# Patient Record
Sex: Female | Born: 1950 | Race: White | Hispanic: No | Marital: Married | Smoking: Former smoker
Health system: Southern US, Community
[De-identification: ages and names within clinical notes are randomized; demographics above are authoritative.]

## PROBLEM LIST (undated history)

## (undated) DIAGNOSIS — D696 Thrombocytopenia, unspecified: Principal | ICD-10-CM

## (undated) DIAGNOSIS — R112 Nausea with vomiting, unspecified: Secondary | ICD-10-CM

## (undated) DIAGNOSIS — I499 Cardiac arrhythmia, unspecified: Secondary | ICD-10-CM

## (undated) DIAGNOSIS — Z9289 Personal history of other medical treatment: Secondary | ICD-10-CM

## (undated) DIAGNOSIS — Z8639 Personal history of other endocrine, nutritional and metabolic disease: Secondary | ICD-10-CM

## (undated) DIAGNOSIS — R296 Repeated falls: Secondary | ICD-10-CM

## (undated) DIAGNOSIS — M8580 Other specified disorders of bone density and structure, unspecified site: Secondary | ICD-10-CM

## (undated) DIAGNOSIS — M1990 Unspecified osteoarthritis, unspecified site: Secondary | ICD-10-CM

## (undated) DIAGNOSIS — Z9889 Other specified postprocedural states: Secondary | ICD-10-CM

## (undated) HISTORY — PX: FRACTURE SURGERY: SHX138

## (undated) HISTORY — PX: EYE SURGERY: SHX253

## (undated) HISTORY — PX: OTHER SURGICAL HISTORY: SHX169

## (undated) HISTORY — PX: DILATION AND CURETTAGE OF UTERUS: SHX78

## (undated) HISTORY — DX: Thrombocytopenia, unspecified: D69.6

## (undated) HISTORY — PX: COLONOSCOPY: SHX174

## (undated) HISTORY — PX: APPENDECTOMY: SHX54

---

## 1998-07-20 ENCOUNTER — Other Ambulatory Visit: Admission: RE | Admit: 1998-07-20 | Discharge: 1998-07-20 | Payer: Self-pay | Admitting: Gynecology

## 1999-10-03 ENCOUNTER — Other Ambulatory Visit: Admission: RE | Admit: 1999-10-03 | Discharge: 1999-10-03 | Payer: Self-pay | Admitting: Gynecology

## 2000-09-10 ENCOUNTER — Encounter: Admission: RE | Admit: 2000-09-10 | Discharge: 2000-09-10 | Payer: Self-pay | Admitting: Gynecology

## 2000-09-10 ENCOUNTER — Encounter: Payer: Self-pay | Admitting: Gynecology

## 2001-02-18 ENCOUNTER — Other Ambulatory Visit: Admission: RE | Admit: 2001-02-18 | Discharge: 2001-02-18 | Payer: Self-pay | Admitting: Gynecology

## 2001-10-02 ENCOUNTER — Encounter: Payer: Self-pay | Admitting: Gynecology

## 2001-10-02 ENCOUNTER — Encounter: Admission: RE | Admit: 2001-10-02 | Discharge: 2001-10-02 | Payer: Self-pay | Admitting: Gynecology

## 2002-05-05 ENCOUNTER — Other Ambulatory Visit: Admission: RE | Admit: 2002-05-05 | Discharge: 2002-05-05 | Payer: Self-pay | Admitting: Gynecology

## 2003-04-15 ENCOUNTER — Ambulatory Visit (HOSPITAL_COMMUNITY): Admission: RE | Admit: 2003-04-15 | Discharge: 2003-04-15 | Payer: Self-pay | Admitting: Gastroenterology

## 2003-07-29 ENCOUNTER — Encounter: Admission: RE | Admit: 2003-07-29 | Discharge: 2003-07-29 | Payer: Self-pay | Admitting: Gynecology

## 2003-07-29 ENCOUNTER — Encounter: Payer: Self-pay | Admitting: Gynecology

## 2003-09-03 ENCOUNTER — Other Ambulatory Visit: Admission: RE | Admit: 2003-09-03 | Discharge: 2003-09-03 | Payer: Self-pay | Admitting: Gynecology

## 2004-09-26 ENCOUNTER — Other Ambulatory Visit: Admission: RE | Admit: 2004-09-26 | Discharge: 2004-09-26 | Payer: Self-pay | Admitting: Gynecology

## 2004-09-27 ENCOUNTER — Ambulatory Visit: Payer: Self-pay | Admitting: Internal Medicine

## 2004-12-09 ENCOUNTER — Encounter: Admission: RE | Admit: 2004-12-09 | Discharge: 2004-12-09 | Payer: Self-pay | Admitting: Gynecology

## 2005-01-30 ENCOUNTER — Ambulatory Visit: Payer: Self-pay | Admitting: Internal Medicine

## 2005-04-15 ENCOUNTER — Emergency Department (HOSPITAL_COMMUNITY): Admission: EM | Admit: 2005-04-15 | Discharge: 2005-04-15 | Payer: Self-pay | Admitting: Emergency Medicine

## 2005-10-25 ENCOUNTER — Other Ambulatory Visit: Admission: RE | Admit: 2005-10-25 | Discharge: 2005-10-25 | Payer: Self-pay | Admitting: Gynecology

## 2006-02-26 ENCOUNTER — Encounter: Admission: RE | Admit: 2006-02-26 | Discharge: 2006-02-26 | Payer: Self-pay | Admitting: Gynecology

## 2007-01-22 ENCOUNTER — Other Ambulatory Visit: Admission: RE | Admit: 2007-01-22 | Discharge: 2007-01-22 | Payer: Self-pay | Admitting: Gynecology

## 2007-03-05 ENCOUNTER — Encounter: Admission: RE | Admit: 2007-03-05 | Discharge: 2007-03-05 | Payer: Self-pay | Admitting: Gynecology

## 2008-03-31 ENCOUNTER — Other Ambulatory Visit: Admission: RE | Admit: 2008-03-31 | Discharge: 2008-03-31 | Payer: Self-pay | Admitting: Family Medicine

## 2008-04-09 ENCOUNTER — Encounter: Admission: RE | Admit: 2008-04-09 | Discharge: 2008-04-09 | Payer: Self-pay | Admitting: Family Medicine

## 2008-08-10 ENCOUNTER — Encounter: Admission: RE | Admit: 2008-08-10 | Discharge: 2008-08-10 | Payer: Self-pay | Admitting: Family Medicine

## 2008-08-25 ENCOUNTER — Ambulatory Visit: Payer: Self-pay | Admitting: Sports Medicine

## 2008-08-25 DIAGNOSIS — M1610 Unilateral primary osteoarthritis, unspecified hip: Secondary | ICD-10-CM | POA: Insufficient documentation

## 2008-08-25 DIAGNOSIS — M169 Osteoarthritis of hip, unspecified: Secondary | ICD-10-CM

## 2008-08-25 DIAGNOSIS — S82899A Other fracture of unspecified lower leg, initial encounter for closed fracture: Secondary | ICD-10-CM | POA: Insufficient documentation

## 2008-10-05 ENCOUNTER — Ambulatory Visit: Payer: Self-pay | Admitting: Sports Medicine

## 2009-09-20 ENCOUNTER — Encounter: Admission: RE | Admit: 2009-09-20 | Discharge: 2009-09-20 | Payer: Self-pay | Admitting: Family Medicine

## 2010-05-24 ENCOUNTER — Other Ambulatory Visit: Admission: RE | Admit: 2010-05-24 | Discharge: 2010-05-24 | Payer: Self-pay | Admitting: Family Medicine

## 2010-10-11 ENCOUNTER — Encounter: Admission: RE | Admit: 2010-10-11 | Discharge: 2010-10-11 | Payer: Self-pay | Admitting: Family Medicine

## 2010-10-25 ENCOUNTER — Encounter: Admission: RE | Admit: 2010-10-25 | Discharge: 2010-10-25 | Payer: Self-pay | Admitting: Family Medicine

## 2010-12-11 ENCOUNTER — Encounter: Payer: Self-pay | Admitting: Family Medicine

## 2011-04-07 NOTE — Op Note (Signed)
   NAME:  Lindsey Young, Lindsey Young                        ACCOUNT NO.:  [id number]   MEDICAL RECORD NO.:  [id number]                   PATIENT TYPE:  AMB   LOCATION:  ENDO                                 FACILITY:  [facility]   PHYSICIAN:  Danise Edge, M.D.                DATE OF BIRTH:  Apr 05, 1951   DATE OF PROCEDURE:  04/15/2003  DATE OF DISCHARGE:                                 OPERATIVE REPORT   PROCEDURE:  Diagnostic colonoscopy.   INDICATIONS:  The patient is a 60 year old female born Mar 23, 2051.  The  patient's father died of metastatic colon cancer.  The patient is here to  undergo her first colonoscopy with polypectomy to prevent colon cancer.   ENDOSCOPIST:  Danise Edge, M.D.   PREMEDICATION:  Versed 10 mg, Demerol 75 mg.   DESCRIPTION OF PROCEDURE:  After obtaining informed consent, the patient was  placed in the left lateral decubitus position.  I administered intravenous  Demerol and intravenous Versed to achieve conscious sedation for the  procedure.  The patient's blood pressure, oxygen saturation, and cardiac  rhythm were monitored throughout the procedure and documented in the medical  record.   Anal inspection was normal.  Digital rectal exam was normal.  The Olympus  pediatric colonoscope was introduced into the rectum and advanced to the  cecum.  A normal-appearing ileocecal valve was intubated and the distal  ileum inspected.  Colonic preparation for the exam today was excellent.   Rectum normal.   Sigmoid colon and descending colon normal.   Splenic flexure normal.   Transverse colon normal.   Hepatic flexure normal.   Ascending colon normal.   Cecum and ileocecal valve normal.   Distal ileum.    ASSESSMENT:  Normal proctocolonoscopy to the cecum.  No endoscopic evidence  for the presence of colorectal neoplasia.                                               Danise Edge, M.D.    MJ/MEDQ  D:  04/15/2003  T:  04/15/2003  Job:  [id number]   cc:    Lilla Shook, M.D.  [address], [address]  [place]  [place]  [address]  Fax: [phone]   Leatha Gilding. Mezer, M.D.  [address]  [place]  [place] [address]  Fax: [phone]

## 2011-10-17 ENCOUNTER — Ambulatory Visit: Payer: 59 | Attending: Family Medicine | Admitting: Physical Therapy

## 2011-10-17 DIAGNOSIS — M2550 Pain in unspecified joint: Secondary | ICD-10-CM | POA: Insufficient documentation

## 2011-10-17 DIAGNOSIS — M2560 Stiffness of unspecified joint, not elsewhere classified: Secondary | ICD-10-CM | POA: Insufficient documentation

## 2011-10-17 DIAGNOSIS — IMO0001 Reserved for inherently not codable concepts without codable children: Secondary | ICD-10-CM | POA: Insufficient documentation

## 2011-10-18 ENCOUNTER — Ambulatory Visit: Payer: 59 | Admitting: Physical Therapy

## 2011-10-25 ENCOUNTER — Encounter: Payer: 59 | Admitting: Physical Therapy

## 2011-10-30 ENCOUNTER — Encounter: Payer: 59 | Admitting: Physical Therapy

## 2011-11-01 ENCOUNTER — Encounter: Payer: 59 | Admitting: Physical Therapy

## 2011-11-06 ENCOUNTER — Encounter: Payer: 59 | Admitting: Physical Therapy

## 2011-11-08 ENCOUNTER — Encounter: Payer: 59 | Admitting: Physical Therapy

## 2011-11-15 ENCOUNTER — Encounter: Payer: 59 | Admitting: Physical Therapy

## 2011-11-22 ENCOUNTER — Encounter: Payer: 59 | Admitting: Physical Therapy

## 2011-11-27 ENCOUNTER — Encounter: Payer: 59 | Admitting: Physical Therapy

## 2011-11-29 ENCOUNTER — Encounter: Payer: 59 | Admitting: Physical Therapy

## 2011-12-19 ENCOUNTER — Other Ambulatory Visit: Payer: Self-pay | Admitting: Family Medicine

## 2011-12-19 DIAGNOSIS — Z1231 Encounter for screening mammogram for malignant neoplasm of breast: Secondary | ICD-10-CM

## 2012-01-01 ENCOUNTER — Ambulatory Visit
Admission: RE | Admit: 2012-01-01 | Discharge: 2012-01-01 | Disposition: A | Discharge status: Home / Self Care | Payer: 59 | Source: Ambulatory Visit | Attending: Family Medicine | Admitting: Family Medicine

## 2012-01-01 DIAGNOSIS — Z1231 Encounter for screening mammogram for malignant neoplasm of breast: Secondary | ICD-10-CM

## 2013-04-09 ENCOUNTER — Other Ambulatory Visit: Payer: Self-pay

## 2013-04-09 DIAGNOSIS — Z1231 Encounter for screening mammogram for malignant neoplasm of breast: Secondary | ICD-10-CM

## 2013-04-22 ENCOUNTER — Ambulatory Visit
Admission: RE | Admit: 2013-04-22 | Discharge: 2013-04-22 | Disposition: A | Discharge status: Home / Self Care | Payer: 59 | Source: Ambulatory Visit

## 2013-04-22 DIAGNOSIS — Z1231 Encounter for screening mammogram for malignant neoplasm of breast: Secondary | ICD-10-CM

## 2013-08-19 ENCOUNTER — Encounter (HOSPITAL_COMMUNITY): Payer: Self-pay | Admitting: Emergency Medicine

## 2013-08-19 ENCOUNTER — Emergency Department (HOSPITAL_COMMUNITY)
Admission: EM | Admit: 2013-08-19 | Discharge: 2013-08-19 | Disposition: A | Discharge status: Home / Self Care | Payer: 59 | Attending: Emergency Medicine | Admitting: Emergency Medicine

## 2013-08-19 ENCOUNTER — Emergency Department (HOSPITAL_COMMUNITY): Payer: 59

## 2013-08-19 DIAGNOSIS — S6990XA Unspecified injury of unspecified wrist, hand and finger(s), initial encounter: Secondary | ICD-10-CM | POA: Insufficient documentation

## 2013-08-19 DIAGNOSIS — S82002A Unspecified fracture of left patella, initial encounter for closed fracture: Secondary | ICD-10-CM

## 2013-08-19 DIAGNOSIS — Z79899 Other long term (current) drug therapy: Secondary | ICD-10-CM | POA: Insufficient documentation

## 2013-08-19 DIAGNOSIS — W010XXA Fall on same level from slipping, tripping and stumbling without subsequent striking against object, initial encounter: Secondary | ICD-10-CM | POA: Insufficient documentation

## 2013-08-19 DIAGNOSIS — S59909A Unspecified injury of unspecified elbow, initial encounter: Secondary | ICD-10-CM | POA: Insufficient documentation

## 2013-08-19 DIAGNOSIS — Y9301 Activity, walking, marching and hiking: Secondary | ICD-10-CM | POA: Insufficient documentation

## 2013-08-19 DIAGNOSIS — Y9289 Other specified places as the place of occurrence of the external cause: Secondary | ICD-10-CM | POA: Insufficient documentation

## 2013-08-19 DIAGNOSIS — S82009A Unspecified fracture of unspecified patella, initial encounter for closed fracture: Secondary | ICD-10-CM | POA: Insufficient documentation

## 2013-08-19 MED ORDER — OXYCODONE-ACETAMINOPHEN 5-325 MG PO TABS
ORAL_TABLET | ORAL | Status: AC
  Administered 2013-08-19: 1 via ORAL
  Filled 2013-08-19: qty 1

## 2013-08-19 MED ORDER — OXYCODONE-ACETAMINOPHEN 5-325 MG PO TABS
1.0000 | ORAL_TABLET | Freq: Once | ORAL | Status: AC
  Administered 2013-08-19: 1 via ORAL

## 2013-08-19 MED ORDER — OXYCODONE-ACETAMINOPHEN 5-325 MG PO TABS: 1.0000 | ORAL_TABLET | Freq: Four times a day (QID) | ORAL | Status: DC | PRN

## 2013-08-19 NOTE — ED Notes (Signed)
Patient fell today on both knees while walking.  She tripped on some pavement.  She is having pain that she rates as a five in the left knee area and right forearm and wrist.  Some swelling and bruising to left knee area.  No bruising or deformity to right arm or wrist.

## 2013-08-19 NOTE — ED Provider Notes (Signed)
CSN: [id number]     Arrival date & time 08/19/13  1846 History   First MD Initiated Contact with Patient 08/19/13 1900     Chief Complaint  Patient presents with  . Fall   (Consider location/radiation/quality/duration/timing/severity/associated sxs/prior Treatment) HPI Patient presents emergency department following a fall that occurred just prior to arrival.  The patient, states she tripped on raised area in the sidewalk.  Patient, states she landed on her left knee and right wrist.  Patient, states, that she felt that her left kneecap split on the raised area of the sidewalk.  Patient denies nausea, vomiting, weakness, numbness, or loss of consciousness.  Patient did not take any medications prior to arrival    No past medical history on file. Past Surgical History  Procedure Laterality Date  . Appendectomy     No family history on file. History  Substance Use Topics  . Smoking status: Not on file  . Smokeless tobacco: Not on file  . Alcohol Use: Not on file   OB History   Grav Para Term Preterm Abortions TAB SAB Ect Mult Living                 Review of Systems All other systems negative except as documented in the HPI. All pertinent positives and negatives as reviewed in the HPI. Allergies  Review of patient's allergies indicates no known allergies.  Home Medications   Current Outpatient Rx  Name  Route  Sig  Dispense  Refill  . cholecalciferol (VITAMIN D) 1000 UNITS tablet   Oral   Take 1,000 Units by mouth daily.         . fish oil-omega-3 fatty acids 1000 MG capsule   Oral   Take 1 g by mouth daily.         Marland Kitchen glucosamine-chondroitin 500-400 MG tablet   Oral   Take 1 tablet by mouth daily.          BP 113/64  Pulse 71  Temp(Src) 97.7 F (36.5 C) (Oral)  Resp 20  Wt 150 lb (68.04 kg)  SpO2 100% Physical Exam  Constitutional: She is oriented to person, place, and time. She appears well-developed and well-nourished. No distress.  HENT:  Head:  Normocephalic and atraumatic.  Cardiovascular: Normal rate, regular rhythm and normal heart sounds.   Pulmonary/Chest: Effort normal and breath sounds normal.  Musculoskeletal:       Right wrist: She exhibits tenderness. She exhibits normal range of motion, no bony tenderness, no swelling, no effusion and no deformity.       Left knee: She exhibits decreased range of motion, swelling, ecchymosis, deformity and abnormal patellar mobility.  Neurological: She is alert and oriented to person, place, and time.    ED Course  Procedures (including critical care time) Labs Review Labs Reviewed - No data to display Imaging Review Dg Elbow Complete Right  08/19/2013   CLINICAL DATA:  Fall  EXAM: RIGHT ELBOW - COMPLETE 3+ VIEW  COMPARISON:  None.  FINDINGS: No fracture or dislocation is seen.  The joint spaces are preserved.  No definite elbow joint effusion.  The visualized soft tissues are unremarkable.  IMPRESSION: No fracture or dislocation is seen.   Electronically Signed   By: Charline Bills M.D.   On: 08/19/2013 19:48   Dg Wrist Complete Right  08/19/2013   CLINICAL DATA:  Fall, right wrist pain  EXAM: RIGHT WRIST - COMPLETE 3+ VIEW  COMPARISON:  None.  FINDINGS: No fracture or dislocation  is seen.  Degenerative changes with radiocarpal narrowing.  The visualized soft tissues are unremarkable.  IMPRESSION: No fracture or dislocation is seen.   Electronically Signed   By: Charline Bills M.D.   On: 08/19/2013 19:47   Dg Knee Complete 4 Views Left  08/19/2013   CLINICAL DATA:  Larey Seat. Left knee pain.  EXAM: LEFT KNEE - COMPLETE 4+ VIEW  COMPARISON:  None  FINDINGS: The patella is fractured in its midportion with moderate separation estimated at 13 mm. A small loose fragment is noted also. The femur and tibia are intact. The fibula is normal. A small joint effusion is noted.  IMPRESSION: Displaced mid pole patellar fracture with slight comminution.   Electronically Signed   By: Loralie Champagne M.D.    On: 08/19/2013 19:48   I spoke with Dr. supple of  orthopedics.  He advised the patient come to his office at 8 AM.  Patient is advised of plan of questions were answered.  Patient does not have any further need for pain control at this time.  She is placed in a knee immobilizer with crutches. MDM      Carlyle Dolly, PA-C 08/19/13 2049

## 2013-08-20 ENCOUNTER — Encounter (HOSPITAL_COMMUNITY): Payer: Self-pay | Admitting: *Deleted

## 2013-08-20 ENCOUNTER — Encounter (HOSPITAL_COMMUNITY): Payer: Self-pay | Admitting: Pharmacy Technician

## 2013-08-20 MED ORDER — CEFAZOLIN SODIUM-DEXTROSE 2-3 GM-% IV SOLR
2.0000 g | INTRAVENOUS | Status: AC
  Administered 2013-08-21: 2 g via INTRAVENOUS
  Filled 2013-08-20: qty 50

## 2013-08-20 NOTE — ED Provider Notes (Signed)
  This was a shared visit with a mid-level provided (NP or PA).  Throughout the patient's course I was available for consultation/collaboration.  I saw the x-ray- I agree with the interpretation.  On my exam the patient was in no distress.  We had a lengthy discussion about patellar fractures, and surgical options.      Gerhard Munch, MD 08/20/13 0001

## 2013-08-21 ENCOUNTER — Ambulatory Visit (HOSPITAL_COMMUNITY): Payer: 59 | Admitting: Anesthesiology

## 2013-08-21 ENCOUNTER — Ambulatory Visit (HOSPITAL_COMMUNITY)
Admission: RE | Admit: 2013-08-21 | Discharge: 2013-08-21 | Disposition: A | Discharge status: Home / Self Care | Payer: 59 | Source: Ambulatory Visit | Attending: Orthopedic Surgery | Admitting: Orthopedic Surgery

## 2013-08-21 ENCOUNTER — Encounter (HOSPITAL_COMMUNITY): Payer: Self-pay

## 2013-08-21 ENCOUNTER — Ambulatory Visit (HOSPITAL_COMMUNITY): Payer: 59

## 2013-08-21 ENCOUNTER — Encounter (HOSPITAL_COMMUNITY)
Admission: RE | Disposition: A | Discharge status: Home / Self Care | Payer: Self-pay | Source: Ambulatory Visit | Attending: Orthopedic Surgery

## 2013-08-21 ENCOUNTER — Encounter (HOSPITAL_COMMUNITY): Payer: Self-pay | Admitting: Anesthesiology

## 2013-08-21 DIAGNOSIS — W19XXXA Unspecified fall, initial encounter: Secondary | ICD-10-CM | POA: Insufficient documentation

## 2013-08-21 DIAGNOSIS — S82009A Unspecified fracture of unspecified patella, initial encounter for closed fracture: Secondary | ICD-10-CM | POA: Insufficient documentation

## 2013-08-21 HISTORY — DX: Cardiac arrhythmia, unspecified: I49.9

## 2013-08-21 HISTORY — DX: Unspecified osteoarthritis, unspecified site: M19.90

## 2013-08-21 HISTORY — DX: Personal history of other medical treatment: Z92.89

## 2013-08-21 HISTORY — PX: ORIF PATELLA: SHX5033

## 2013-08-21 HISTORY — DX: Personal history of other endocrine, nutritional and metabolic disease: Z86.39

## 2013-08-21 HISTORY — DX: Other specified postprocedural states: Z98.890

## 2013-08-21 HISTORY — DX: Other specified postprocedural states: R11.2

## 2013-08-21 LAB — BASIC METABOLIC PANEL
BUN: 16 mg/dL (ref 6–23)
Calcium: 8.9 mg/dL (ref 8.4–10.5)
Chloride: 104 mEq/L (ref 96–112)
Creatinine, Ser: 0.78 mg/dL (ref 0.50–1.10)
GFR calc Af Amer: >90 mL/min (ref >90)
GFR calc non Af Amer: 88 mL/min — ABNORMAL LOW (ref >90)
Potassium: 4.7 mEq/L (ref 3.5–5.1)
Sodium: 138 mEq/L (ref 135–145)

## 2013-08-21 LAB — CBC
HCT: 40.2 % (ref 36.0–46.0)
Hemoglobin: 14.1 g/dL (ref 12.0–15.0)
MCH: 30.6 pg (ref 26.0–34.0)
MCHC: 35.1 g/dL (ref 30.0–36.0)
Platelets: 155 10*3/uL (ref 150–400)
RDW: 13.1 % (ref 11.5–15.5)
WBC: 7.2 10*3/uL (ref 4.0–10.5)

## 2013-08-21 SURGERY — OPEN REDUCTION INTERNAL FIXATION (ORIF) PATELLA
Anesthesia: Regional | Site: Knee | Laterality: Left | Wound class: Clean

## 2013-08-21 MED ORDER — OXYCODONE-ACETAMINOPHEN 5-325 MG PO TABS: 1.0000 | ORAL_TABLET | ORAL | Status: DC | PRN

## 2013-08-21 MED ORDER — HYDROMORPHONE HCL PF 1 MG/ML IJ SOLN: 0.2500 mg | INTRAMUSCULAR | Status: DC | PRN

## 2013-08-21 MED ORDER — FENTANYL CITRATE 0.05 MG/ML IJ SOLN
50.0000 ug | INTRAMUSCULAR | Status: DC | PRN
  Administered 2013-08-21: 50 ug via INTRAVENOUS
  Filled 2013-08-21: qty 2

## 2013-08-21 MED ORDER — FENTANYL CITRATE 0.05 MG/ML IJ SOLN
INTRAMUSCULAR | Status: DC | PRN
  Administered 2013-08-21 (×4): 25 ug via INTRAVENOUS

## 2013-08-21 MED ORDER — CHLORHEXIDINE GLUCONATE 4 % EX LIQD: 60.0000 mL | Freq: Once | CUTANEOUS | Status: DC

## 2013-08-21 MED ORDER — OXYCODONE HCL 5 MG/5ML PO SOLN: 5.0000 mg | Freq: Once | ORAL | Status: DC | PRN

## 2013-08-21 MED ORDER — ONDANSETRON HCL 4 MG/2ML IJ SOLN
INTRAMUSCULAR | Status: DC | PRN
  Administered 2013-08-21 (×2): 4 mg via INTRAVENOUS

## 2013-08-21 MED ORDER — MIDAZOLAM HCL 5 MG/5ML IJ SOLN
INTRAMUSCULAR | Status: DC | PRN
  Administered 2013-08-21: 2 mg via INTRAVENOUS

## 2013-08-21 MED ORDER — CYCLOBENZAPRINE HCL 10 MG PO TABS: 10.0000 mg | ORAL_TABLET | Freq: Three times a day (TID) | ORAL | Status: DC | PRN

## 2013-08-21 MED ORDER — LIDOCAINE HCL (CARDIAC) 20 MG/ML IV SOLN
INTRAVENOUS | Status: DC | PRN
  Administered 2013-08-21: 70 mg via INTRAVENOUS

## 2013-08-21 MED ORDER — DEXAMETHASONE SODIUM PHOSPHATE 4 MG/ML IJ SOLN
INTRAMUSCULAR | Status: DC | PRN
  Administered 2013-08-21: 4 mg via INTRAVENOUS

## 2013-08-21 MED ORDER — PROPOFOL 10 MG/ML IV BOLUS
INTRAVENOUS | Status: DC | PRN
  Administered 2013-08-21: 150 mg via INTRAVENOUS

## 2013-08-21 MED ORDER — LACTATED RINGERS IV SOLN
INTRAVENOUS | Status: DC
  Administered 2013-08-21: 10 mL/h via INTRAVENOUS
  Administered 2013-08-21: 16:00:00 via INTRAVENOUS

## 2013-08-21 MED ORDER — MIDAZOLAM HCL 2 MG/2ML IJ SOLN
1.0000 mg | INTRAMUSCULAR | Status: DC | PRN
  Administered 2013-08-21: 1 mg via INTRAVENOUS
  Filled 2013-08-21: qty 2

## 2013-08-21 MED ORDER — BUPIVACAINE-EPINEPHRINE PF 0.5-1:200000 % IJ SOLN
INTRAMUSCULAR | Status: DC | PRN
  Administered 2013-08-21: 30 mL

## 2013-08-21 MED ORDER — OXYCODONE HCL 5 MG PO TABS: 5.0000 mg | ORAL_TABLET | Freq: Once | ORAL | Status: DC | PRN

## 2013-08-21 MED ORDER — ONDANSETRON HCL 4 MG/2ML IJ SOLN: 4.0000 mg | Freq: Four times a day (QID) | INTRAMUSCULAR | Status: DC | PRN

## 2013-08-21 SURGICAL SUPPLY — 61 items
BANDAGE ELASTIC 4 VELCRO ST LF (GAUZE/BANDAGES/DRESSINGS) ×2 IMPLANT
BANDAGE ELASTIC 6 VELCRO ST LF (GAUZE/BANDAGES/DRESSINGS) ×2 IMPLANT
BANDAGE GAUZE ELAST BULKY 4 IN (GAUZE/BANDAGES/DRESSINGS) ×2 IMPLANT
BIT DRILL CANN 3.2MM (BIT) IMPLANT
BLADE SURG ROTATE 9660 (MISCELLANEOUS) ×1 IMPLANT
CANISTER SUCTION 2500CC (MISCELLANEOUS) ×1 IMPLANT
CLOTH BEACON ORANGE TIMEOUT ST (SAFETY) ×1 IMPLANT
COVER SURGICAL LIGHT HANDLE (MISCELLANEOUS) ×3 IMPLANT
CUFF TOURNIQUET SINGLE 34IN LL (TOURNIQUET CUFF) ×1 IMPLANT
CUFF TOURNIQUET SINGLE 44IN (TOURNIQUET CUFF) IMPLANT
DRAPE C-ARM 42X72 X-RAY (DRAPES) ×3 IMPLANT
DRAPE ORTHO SPLIT 77X108 STRL (DRAPES) ×2
DRAPE SURG ORHT 6 SPLT 77X108 (DRAPES) ×2 IMPLANT
DRILL BIT CANN 3.2MM (BIT) ×1
DRSG ADAPTIC 3X8 NADH LF (GAUZE/BANDAGES/DRESSINGS) ×2 IMPLANT
DRSG PAD ABDOMINAL 8X10 ST (GAUZE/BANDAGES/DRESSINGS) ×2 IMPLANT
DURAPREP 26ML APPLICATOR (WOUND CARE) ×2 IMPLANT
ELECT REM PT RETURN 9FT ADLT (ELECTROSURGICAL) ×2
ELECTRODE REM PT RTRN 9FT ADLT (ELECTROSURGICAL) ×1 IMPLANT
GLOVE BIO SURGEON STRL SZ7.5 (GLOVE) ×2 IMPLANT
GLOVE BIO SURGEON STRL SZ8 (GLOVE) ×2 IMPLANT
GLOVE EUDERMIC 7 POWDERFREE (GLOVE) ×2 IMPLANT
GLOVE SS BIOGEL STRL SZ 7.5 (GLOVE) ×1 IMPLANT
GLOVE SUPERSENSE BIOGEL SZ 7.5 (GLOVE) ×1
GOWN STRL REIN XL XLG (GOWN DISPOSABLE) ×6 IMPLANT
GUIDEWIRE THREADED 1.6 (WIRE) ×3 IMPLANT
IMMOBILIZER KNEE 24 THIGH 36 (MISCELLANEOUS) IMPLANT
IMMOBILIZER KNEE 24 UNIV (MISCELLANEOUS) ×2
KIT BASIN OR (CUSTOM PROCEDURE TRAY) ×3 IMPLANT
KIT ROOM TURNOVER OR (KITS) ×2 IMPLANT
MANIFOLD NEPTUNE II (INSTRUMENTS) ×1 IMPLANT
NDL STRAIGHT KEITH (NEEDLE) IMPLANT
NEEDLE 22X1 1/2 (OR ONLY) (NEEDLE) IMPLANT
NEEDLE STRAIGHT KEITH (NEEDLE) ×2 IMPLANT
NS IRRIG 1000ML POUR BTL (IV SOLUTION) ×2 IMPLANT
PACK ORTHO EXTREMITY (CUSTOM PROCEDURE TRAY) ×3 IMPLANT
PAD ARMBOARD 7.5X6 YLW CONV (MISCELLANEOUS) ×4 IMPLANT
PAD CAST 4YDX4 CTTN HI CHSV (CAST SUPPLIES) ×2 IMPLANT
PADDING CAST COTTON 4X4 STRL (CAST SUPPLIES) ×2
SCREW CANN 20MM (Screw) ×2 IMPLANT
SPONGE GAUZE 4X4 12PLY (GAUZE/BANDAGES/DRESSINGS) ×2 IMPLANT
SPONGE LAP 18X18 X RAY DECT (DISPOSABLE) ×3 IMPLANT
STAPLER VISISTAT 35W (STAPLE) ×1 IMPLANT
STRIP CLOSURE SKIN 1/2X4 (GAUZE/BANDAGES/DRESSINGS) ×1 IMPLANT
SUCTION FRAZIER TIP 10 FR DISP (SUCTIONS) ×2 IMPLANT
SUT FIBERWIRE #2 38 REV NDL BL (SUTURE) ×2
SUT STEEL 5 V 56 M (SUTURE) ×1 IMPLANT
SUT VIC AB 0 CT1 27 (SUTURE) ×2
SUT VIC AB 0 CT1 27XBRD ANBCTR (SUTURE) ×1 IMPLANT
SUT VIC AB 2-0 CT1 27 (SUTURE) ×4
SUT VIC AB 2-0 CT1 TAPERPNT 27 (SUTURE) ×2 IMPLANT
SUT WIRE 18GA (Wire) ×2 IMPLANT
SUTURE FIBERWR#2 38 REV NDL BL (SUTURE) IMPLANT
SYR CONTROL 10ML LL (SYRINGE) IMPLANT
TOWEL OR 17X24 6PK STRL BLUE (TOWEL DISPOSABLE) ×3 IMPLANT
TOWEL OR 17X26 10 PK STRL BLUE (TOWEL DISPOSABLE) ×3 IMPLANT
TUBE CONNECTING 12X1/4 (SUCTIONS) ×3 IMPLANT
UNDERPAD 30X30 INCONTINENT (UNDERPADS AND DIAPERS) ×1 IMPLANT
WATER STERILE IRR 1000ML POUR (IV SOLUTION) ×2 IMPLANT
WIRE 18GA COCR 1PK (WIRE) ×1 IMPLANT
YANKAUER SUCT BULB TIP NO VENT (SUCTIONS) IMPLANT

## 2013-08-21 NOTE — Anesthesia Postprocedure Evaluation (Signed)
Anesthesia Post Note  Patient: Lindsey Young  Procedure(s) Performed: Procedure(s) (LRB): OPEN REDUCTION INTERNAL (ORIF) OF A LEFT PATELLA FRACTURE/POSSIBLE LEFT PARTIAL PATELLALECTOMY (Left)  Anesthesia type: General  Patient location: PACU  Post pain: Pain level controlled and Adequate analgesia  Post assessment: Post-op Vital signs reviewed, Patient's Cardiovascular Status Stable, Respiratory Function Stable, Patent Airway and Pain level controlled  Last Vitals:  Filed Vitals:   08/21/13 1730  BP: 112/63  Pulse: 57  Temp:   Resp: 13    Post vital signs: Reviewed and stable  Level of consciousness: awake, alert  and oriented  Complications: No apparent anesthesia complications

## 2013-08-21 NOTE — H&P (Signed)
Sherlie Ban    Chief Complaint: LEFT DISPLACED PATELLA FRACTURE HPI: The patient is a 62 y.o. female with a displaced left patella fracture  Past Medical History  Diagnosis Date  . 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 H/O thyroid cyst   . Arthritis     bilateral hips  . PONV (postoperative nausea and vomiting)   . H/O cardiovascular stress test     seen by Dr. Mendel Ryder for SVT- (2007 approx.), then referred to Dr. Graciela Husbands, , told not a candidate for ablation.     Past Surgical History  Procedure Laterality Date  . Appendectomy    . Dilation and curettage of uterus    . Colonoscopy    . Eye surgery      left eye cataract removed  . Fracture surgery      trimalleallor fracture, foot & ankle fixation     History reviewed. No pertinent family history.  Social History:  reports that she has quit smoking. She has never used smokeless tobacco. She reports that  drinks alcohol. She reports that she does not use illicit drugs.  Allergies:  Allergies  Allergen Reactions  . Codeine Nausea And Vomiting    Make pain WORSE    Medications Prior to Admission  Medication Sig Dispense Refill  . acetaminophen (TYLENOL) 325 MG tablet Take 650 mg by mouth every 6 (six) hours as needed for pain.      . cholecalciferol (VITAMIN D) 1000 UNITS tablet Take 1,000 Units by mouth daily.      . fish oil-omega-3 fatty acids 1000 MG capsule Take 1 g by mouth daily.      Marland Kitchen glucosamine-chondroitin 500-400 MG tablet Take 1 tablet by mouth daily.      Marland Kitchen oxyCODONE-acetaminophen (PERCOCET/ROXICET) 5-325 MG per tablet Take 1 tablet by mouth every 6 (six) hours as needed for pain.  20 tablet  0  . TURMERIC PO Take 1 capsule by mouth daily.         Physical Exam: Left knee with swelling and tenderness about left knee as noted a yesterdays office visit, and xrays showing displaced patella fracture  Vitals  Temp:  [97.3 F (36.3 C)] 97.3 F (36.3 C) (10/02 1224) Pulse Rate:   [60] 60 (10/02 1224) Resp:  [20] 20 (10/02 1224) BP: (119)/(62) 119/62 mmHg (10/02 1224) SpO2:  [100 %] 100 % (10/02 1224)  Assessment/Plan  Impression: LEFT DISPLACED PATELLA FRACTURE  Plan of Action: Procedure(s): OPEN REDUCTION INTERNAL (ORIF) OF A LEFT PATELLA FRACTURE/POSSIBLE LEFT PARTIAL PATELLALECTOMY  Daisha Filosa M 08/21/2013, 2:36 PM

## 2013-08-21 NOTE — Transfer of Care (Signed)
Immediate Anesthesia Transfer of Care Note  Patient: Lindsey Young  Procedure(s) Performed: Procedure(s): OPEN REDUCTION INTERNAL (ORIF) OF A LEFT PATELLA FRACTURE/POSSIBLE LEFT PARTIAL PATELLALECTOMY (Left)  Patient Location: PACU  Anesthesia Type:GA combined with regional for post-op pain  Level of Consciousness: awake, alert  and oriented  Airway & Oxygen Therapy: Patient Spontanous Breathing and Patient connected to nasal cannula oxygen  Post-op Assessment: Report given to PACU RN, Post -op Vital signs reviewed and stable and Patient moving all extremities X 4  Post vital signs: Reviewed and stable  Complications: No apparent anesthesia complications

## 2013-08-21 NOTE — Anesthesia Preprocedure Evaluation (Addendum)
Anesthesia Evaluation  Patient identified by MRN, date of birth, ID band Patient awake    Reviewed: Allergy & Precautions, H&P , NPO status , Patient's Chart, lab work & pertinent test results  History of Anesthesia Complications (+) PONV  Airway Mallampati: II  Neck ROM: full    Dental  (+) Dental Advisory Given and Teeth Intact   Pulmonary former smoker,          Cardiovascular negative cardio ROS      Neuro/Psych    GI/Hepatic   Endo/Other    Renal/GU      Musculoskeletal  (+) Arthritis -,   Abdominal   Peds  Hematology   Anesthesia Other Findings   Reproductive/Obstetrics                          Anesthesia Physical Anesthesia Plan  ASA: II  Anesthesia Plan: General and Regional   Post-op Pain Management: MAC Combined w/ Regional for Post-op pain   Induction: Intravenous  Airway Management Planned: LMA  Additional Equipment:   Intra-op Plan:   Post-operative Plan:   Informed Consent: I have reviewed the patients History and Physical, chart, labs and discussed the procedure including the risks, benefits and alternatives for the proposed anesthesia with the patient or authorized representative who has indicated his/her understanding and acceptance.   Dental advisory given  Plan Discussed with: CRNA, Anesthesiologist and Surgeon  Anesthesia Plan Comments:        Anesthesia Quick Evaluation

## 2013-08-21 NOTE — Op Note (Signed)
08/21/2013  4:31 PM  PATIENT:   Lindsey Young  62 y.o. female  PRE-OPERATIVE DIAGNOSIS:  LEFT DISPLACED PATELLA FRACTURE  POST-OPERATIVE DIAGNOSIS:  same  PROCEDURE:  ORIF  SURGEON:  Zadiel Leyh, Vania Rea. M.D.  ASSISTANTS: Shuford pac   ANESTHESIA:   GET + FNB  EBL: min   SPECIMEN:  none  Drains: none  TT <1hr   PATIENT DISPOSITION:  PACU - hemodynamically stable.    PLAN OF CARE: Discharge to home after PACU  Dictation# [id number]

## 2013-08-21 NOTE — Anesthesia Procedure Notes (Addendum)
Anesthesia Regional Block:  Femoral nerve block  Pre-Anesthetic Checklist: ,, timeout performed, Correct Patient, Correct Site, Correct Laterality, Correct Procedure,, site marked, risks and benefits discussed, Surgical consent,  Pre-op evaluation,  At surgeon's request and post-op pain management  Laterality: Left  Prep: chloraprep       Needles:  Injection technique: Single-shot  Needle Type: Echogenic Stimulator Needle     Needle Length: 9cm  Needle Gauge: 21 and 21 G    Additional Needles:  Procedures: nerve stimulator Femoral nerve block  Nerve Stimulator or Paresthesia:  Response: Quadriceps muscle contraction, 0.45 mA,   Additional Responses:   Narrative:  Start time: 08/21/2013 1:20 PM End time: 08/21/2013 1:30 PM Injection made incrementally with aspirations every 5 mL.  Performed by: Personally  Anesthesiologist: Dr Chaney Malling  Additional Notes: Functioning IV was confirmed and monitors were applied.  A 90mm 21ga Arrow echogenic stimulator needle was used. Sterile prep and drape,hand hygiene and sterile gloves were used.  Negative aspiration and negative test dose prior to incremental administration of local anesthetic. The patient tolerated the procedure well.    Femoral nerve block Procedure Name: LMA Insertion Date/Time: 08/21/2013 3:17 PM Performed by: Elon Alas Pre-anesthesia Checklist: Patient identified, Timeout performed, Emergency Drugs available, Suction available and Patient being monitored Patient Re-evaluated:Patient Re-evaluated prior to inductionOxygen Delivery Method: Circle system utilized Preoxygenation: Pre-oxygenation with 100% oxygen Intubation Type: IV induction LMA: LMA inserted LMA Size: 4.0 Number of attempts: 1 Placement Confirmation: positive ETCO2 and breath sounds checked- equal and bilateral Tube secured with: Tape Dental Injury: Teeth and Oropharynx as per pre-operative assessment

## 2013-08-21 NOTE — Preoperative (Signed)
Beta Blockers   Reason not to administer Beta Blockers:Not Applicable 

## 2013-08-22 ENCOUNTER — Encounter (HOSPITAL_COMMUNITY): Payer: Self-pay | Admitting: Orthopedic Surgery

## 2013-08-22 NOTE — Op Note (Signed)
NAMENIEMAH, SCHWEBKE NO.:  [id number]  MEDICAL RECORD NO.:  [id number]  LOCATION:  [facility]                         FACILITY:  [facility]  PHYSICIAN:  Vania Rea. Tristian Sickinger, M.D.  DATE OF BIRTH:  [birth year]  DATE OF PROCEDURE:  08/21/2013 DATE OF DISCHARGE:  08/21/2013                              OPERATIVE REPORT   PREOPERATIVE DIAGNOSIS:  Displaced left patella fracture.  POSTOPERATIVE DIAGNOSIS:  Displaced left patella fracture.  PROCEDURE:  Open reduction and internal fixation of a displaced left patella fracture.  SURGEON:  Vania Rea. Nethaniel Mattie, M.D.  Threasa HeadsFrench Ana A. Shuford, PA-C.  ANESTHESIA:  General endotracheal as well as a femoral nerve block.  TOURNIQUET TIME:  Less than 1 hour.  ESTIMATED BLOOD LOSS:  Minimal.  DRAINS:  None.  HISTORY OF PRESENT ILLNESS:  Ms. Garno is a 62 year old female who fell directly on the anterior aspect of the flexed left knee sustaining a displaced mid patella fracture with some comminution.  Due to the degree of displacement, she is brought to the operating room at this time for planned open reduction and internal fixation.  Preoperatively, we counseled Ms. Tonche on treatment options as well as risks versus benefits thereof.  Possible surgical complications were reviewed including the potential for bleeding, infection, neurovascular injury, malunion, nonunion, loss of fixation, and possible need for additional surgery, and potential for anesthetic complications.  She understands and accepts and agrees with our planned procedure.  PROCEDURE IN DETAIL:  After undergoing routine preop evaluation, the patient received prophylactic antibiotics and a femoral nerve block was established in the holding area by the Anesthesia Department.  Placed supine on the operating table, underwent smooth induction of a general endotracheal anesthesia.  A tourniquet was applied to left thigh.  Left leg was sterilely prepped and draped in  standard fashion.  Time-out was called.  The left lower extremity was exsanguinated with the tourniquet inflated to 300 mmHg.  An anterior midline incision was then made approximately 8 cm in length centered over the patella.  Skin flaps were elevated.  The fracture site was readily identified and exposed.  There was abundant clot interposed at the fracture site and this was all meticulously excised and then the fracture site was irrigated as was the distal portions of the knee joint.  There was a single fracture fragment emanating laterally from the inferior half of the patella, small triangular portion, but the other balance of the patella was in 2 major fragments proximally and distally.  These were carefully realigned anatomically under direct visualization, and a large tenaculum was then used to grasp the patella in order to create compression across the fracture site.  We used fluoroscopic imaging then to confirm the overall alignment to our satisfaction.  I then placed 2 threaded tipped guidewires from proximal to distal transversely through the patella and used fluoroscopic imaging to confirm they were properly positioned.  We then selected two 46 mm 4.5 cannulated screws.  We drilled over the guidewires and then placed 2 screws with good purchase and fixation. The guidewire was removed and then I passed an 18-gauge stainless steel wire through the cannulas of the 5 screws in a figure-of-eight  pattern and this was then tensioned allowing additional compression and stabilization of the patella fracture.  Our final images showed good alignment of fracture site, good position of the hardware.  The small lateral fragment of bone was then reapproximated into its defect and I should mention it looked to be extra-articular, but it was then sutured into position grasping the surrounding periosteum and had overall good alignment and position.  At this point, then the deep fascial  layers were reapproximated, 2-0 Vicryl was used for the deep superficial closure, intracuticular 3-0 Monocryl for the skin followed by Steri- Strips.  Dry dressing then applied.  Leg was placed back in full extension.  Knee immobilizer.  Tourniquet was let down and the patient was awakened, extubated, and taken to the recovery room in stable condition.     Vania Rea. Margeaux Swantek, M.D.     KMS/MEDQ  D:  08/21/2013  T:  08/22/2013  Job:  [id number]

## 2014-09-24 ENCOUNTER — Other Ambulatory Visit (HOSPITAL_COMMUNITY)
Admission: RE | Admit: 2014-09-24 | Discharge: 2014-09-24 | Disposition: A | Discharge status: Home / Self Care | Payer: 59 | Source: Ambulatory Visit | Attending: Family Medicine | Admitting: Family Medicine

## 2014-09-24 ENCOUNTER — Other Ambulatory Visit: Payer: Self-pay | Admitting: Family Medicine

## 2014-09-24 DIAGNOSIS — Z01419 Encounter for gynecological examination (general) (routine) without abnormal findings: Secondary | ICD-10-CM | POA: Diagnosis present

## 2014-09-24 DIAGNOSIS — Z1231 Encounter for screening mammogram for malignant neoplasm of breast: Secondary | ICD-10-CM

## 2014-09-24 DIAGNOSIS — M8580 Other specified disorders of bone density and structure, unspecified site: Secondary | ICD-10-CM

## 2014-09-25 LAB — CYTOLOGY - PAP

## 2014-10-06 ENCOUNTER — Telehealth: Payer: Self-pay | Admitting: Hematology and Oncology

## 2014-10-06 NOTE — Telephone Encounter (Signed)
LEFT MESSAGE FOR PATIENT AND GAVE NP APPT FOR 11/23 @ 10:15 W/DR. [place]. CONTACT INFORMATION WAS LEFT FOR PATIENT TO RETURN CALL TO CONFIRM MESSAGE/NP APPT.

## 2014-10-12 ENCOUNTER — Ambulatory Visit (HOSPITAL_BASED_OUTPATIENT_CLINIC_OR_DEPARTMENT_OTHER): Payer: 59 | Admitting: Hematology and Oncology

## 2014-10-12 ENCOUNTER — Encounter: Payer: Self-pay | Admitting: Hematology and Oncology

## 2014-10-12 ENCOUNTER — Ambulatory Visit: Payer: 59

## 2014-10-12 ENCOUNTER — Telehealth: Payer: Self-pay | Admitting: Hematology and Oncology

## 2014-10-12 ENCOUNTER — Ambulatory Visit (HOSPITAL_BASED_OUTPATIENT_CLINIC_OR_DEPARTMENT_OTHER): Payer: 59

## 2014-10-12 VITALS — BP 116/59 | HR 56 | Temp 97.5°F | Resp 17 | Ht 68.5 in | Wt 155.9 lb

## 2014-10-12 DIAGNOSIS — M1990 Unspecified osteoarthritis, unspecified site: Secondary | ICD-10-CM

## 2014-10-12 DIAGNOSIS — D696 Thrombocytopenia, unspecified: Secondary | ICD-10-CM

## 2014-10-12 HISTORY — DX: Unspecified osteoarthritis, unspecified site: M19.90

## 2014-10-12 HISTORY — DX: Thrombocytopenia, unspecified: D69.6

## 2014-10-12 LAB — CBC WITH DIFFERENTIAL/PLATELET
BASO%: 0.2 % (ref 0.0–2.0)
Basophils Absolute: 0 10*3/uL (ref 0.0–0.1)
EOS%: 0.5 % (ref 0.0–7.0)
Eosinophils Absolute: 0 10*3/uL (ref 0.0–0.5)
HEMATOCRIT: 45.5 % (ref 34.8–46.6)
HEMOGLOBIN: 15.5 g/dL (ref 11.6–15.9)
LYMPH%: 30.5 % (ref 14.0–49.7)
MCH: 29.9 pg (ref 25.1–34.0)
MCHC: 34.1 g/dL (ref 31.5–36.0)
MCV: 87.8 fL (ref 79.5–101.0)
MONO#: 0.4 10*3/uL (ref 0.1–0.9)
MONO%: 6.4 % (ref 0.0–14.0)
NEUT#: 3.7 10*3/uL (ref 1.5–6.5)
NEUT%: 62.4 % (ref 38.4–76.8)
RBC: 5.18 10*6/uL (ref 3.70–5.45)
RDW: 12.9 % (ref 11.2–14.5)
WBC: 6 10*3/uL (ref 3.9–10.3)
lymph#: 1.8 10*3/uL (ref 0.9–3.3)
nRBC: 0 % (ref 0–0)

## 2014-10-12 LAB — MORPHOLOGY
PLT EST: ADEQUATE
Platelet Morphology: NONE SEEN

## 2014-10-12 LAB — CHCC SMEAR

## 2014-10-12 NOTE — Telephone Encounter (Signed)
Gave avs & cal for Dec. °

## 2014-10-12 NOTE — Assessment & Plan Note (Signed)
I have reviewed her peripheral smear and confirmed that her low platelet count observed elsewhere is spurious due to clumping. I reassured the patient that no further workup is needed.

## 2014-10-12 NOTE — Progress Notes (Signed)
Checked in new pt with no financial concerns at this time.  Pt informed me that she is here for a hematology concern so financial concerns may not be needed but she has Raquel's card for any questions or concerns.

## 2014-10-12 NOTE — Progress Notes (Signed)
**Lindsey Young De-Identified via Obfuscation** Lindsey Young  Patient Care Team: Cari Caraway, MD as PCP - General  CHIEF COMPLAINTS/PURPOSE OF CONSULTATION:  Chronic thrombocytopenia  HISTORY OF PRESENTING ILLNESS:  Lindsey Young 63 y.o. female is here because of thrombocytopenia.  She was found to have abnormal CBC from routine blood draw. The patient is a regular blood donor. She tried to donate her platelets twice at the blood Bank and was told it was low. She recalled her platelet count being somewhere around 112,000 to 123,000. Most recently, she went to have her blood work drawn at her primary care physician office and was noted to be low at 93,000. She denies recent bruising/bleeding, such as spontaneous epistaxis, hematuria, melena or hematochezia The patient denies history of liver disease, exposure to heparin, history of cardiac murmur/prior cardiovascular surgery or recent new medications She denies prior blood or platelet transfusions   MEDICAL HISTORY:  Past Medical History  Diagnosis Date  . 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 H/O thyroid cyst   . Arthritis     bilateral hips  . PONV (postoperative nausea and vomiting)   . H/O cardiovascular stress test     seen by Dr. Linard Millers for SVT- (2007 approx.), then referred to Dr. Caryl Comes, , told not a candidate for ablation.   . Thrombocytopenia 10/12/2014  . Arthritis 10/12/2014    SURGICAL HISTORY: Past Surgical History  Procedure Laterality Date  . Appendectomy    . Dilation and curettage of uterus    . Colonoscopy    . Eye surgery      left eye cataract removed  . Fracture surgery      trimalleallor fracture, foot & ankle fixation   . Orif patella Left 08/21/2013    Procedure: OPEN REDUCTION INTERNAL (ORIF) OF A LEFT PATELLA FRACTURE/POSSIBLE LEFT PARTIAL PATELLALECTOMY;  Surgeon: Marin Shutter, MD;  Location: [facility];  Service: Orthopedics;  Laterality: Left;    SOCIAL HISTORY: History   Social  History  . Marital Status: Married    Spouse Name: N/A    Number of Children: N/A  . Years of Education: N/A   Occupational History  . Not on file.   Social History Main Topics  . Smoking status: Former Research scientist (life sciences)  . Smokeless tobacco: Never Used  . Alcohol Use: 4.2 oz/week    7 Glasses of wine per week     Comment: 7 glasses / week  . Drug Use: No  . Sexual Activity: Not on file   Other Topics Concern  . Not on file   Social History Narrative    FAMILY HISTORY: Family History  Problem Relation Age of Onset  . Cancer Father     liver cancer, DVT  . Cancer Brother     DVT and PE    ALLERGIES:  is allergic to codeine.  MEDICATIONS:  Current Outpatient Prescriptions  Medication Sig Dispense Refill  . cholecalciferol (VITAMIN D) 1000 UNITS tablet Take 1,000 Units by mouth daily.    . fish oil-omega-3 fatty acids 1000 MG capsule Take 1 g by mouth daily.    Marland Kitchen glucosamine-chondroitin 500-400 MG tablet Take 1 tablet by mouth daily.    . Misc Natural Products (TART CHERRY ADVANCED) CAPS Take by mouth daily.     No current facility-administered medications for this visit.    REVIEW OF SYSTEMS:   Constitutional: Denies fevers, chills or abnormal night sweats Eyes: Denies blurriness of vision, double vision  or watery eyes Ears, nose, mouth, throat, and face: Denies mucositis or sore throat Respiratory: Denies cough, dyspnea or wheezes Cardiovascular: Denies palpitation, chest discomfort or lower extremity swelling Gastrointestinal:  Denies nausea, heartburn or change in bowel habits Skin: Denies abnormal skin rashes Lymphatics: Denies new lymphadenopathy or easy bruising Neurological:Denies numbness, tingling or new weaknesses Behavioral/Psych: Mood is stable, no new changes  All other systems were reviewed with the patient and are negative.  PHYSICAL EXAMINATION: ECOG PERFORMANCE STATUS:0   Filed Vitals:   10/12/14 1036  BP: 116/59  Pulse: 56  Temp: 97.5 F (36.4  C)  Resp: 17   Filed Weights   10/12/14 1036  Weight: 155 lb 14.4 oz (70.716 kg)    GENERAL:alert, no distress and comfortable SKIN: skin color, texture, turgor are normal, no rashes or significant lesions EYES: normal, conjunctiva are pink and non-injected, sclera clear OROPHARYNX:no exudate, no erythema and lips, buccal mucosa, and tongue normal  NECK: supple, thyroid normal size, non-tender, without nodularity LYMPH:  no palpable lymphadenopathy in the cervical, axillary or inguinal LUNGS: clear to auscultation and percussion with normal breathing effort HEART: regular rate & rhythm and no murmurs and no lower extremity edema ABDOMEN:abdomen soft, non-tender and normal bowel sounds Musculoskeletal:no cyanosis of digits and no clubbing  PSYCH: alert & oriented x 3 with fluent speech NEURO: no focal motor/sensory deficits  LABORATORY DATA:  I have reviewed the data as listed Recent Results (from the past 2160 hour(s))  Cytology - PAP     Status: None   Collection Time: 09/24/14 12:00 AM  Result Value Ref Range   CYTOLOGY - PAP PAP RESULT   CBC with Differential     Status: None   Collection Time: 10/12/14 11:09 AM  Result Value Ref Range   WBC 6.0 3.9 - 10.3 10e3/uL   NEUT# 3.7 1.5 - 6.5 10e3/uL   HGB 15.5 11.6 - 15.9 g/dL   HCT 45.5 34.8 - 46.6 %   Platelets 221 Platelet count consistent in citrate 145 - 400 10e3/uL   MCV 87.8 79.5 - 101.0 fL   MCH 29.9 25.1 - 34.0 pg   MCHC 34.1 31.5 - 36.0 g/dL   RBC 5.18 3.70 - 5.45 10e6/uL   RDW 12.9 11.2 - 14.5 %   lymph# 1.8 0.9 - 3.3 10e3/uL   MONO# 0.4 0.1 - 0.9 10e3/uL   Eosinophils Absolute 0.0 0.0 - 0.5 10e3/uL   Basophils Absolute 0.0 0.0 - 0.1 10e3/uL   NEUT% 62.4 38.4 - 76.8 %   LYMPH% 30.5 14.0 - 49.7 %   MONO% 6.4 0.0 - 14.0 %   EOS% 0.5 0.0 - 7.0 %   BASO% 0.2 0.0 - 2.0 %   nRBC 0 0 - 0 %  Morphology     Status: None   Collection Time: 10/12/14 11:09 AM  Result Value Ref Range   Polychromasia Slight Slight    Ovalocytes Few Negative   White Cell Comments Oc Variant Lymphs    PLT EST Adequate Adequate   Platelet Morphology Oc large platelet, No clumps seen on smear Within Normal Limits  Smear     Status: None   Collection Time: 10/12/14 11:09 AM  Result Value Ref Range   Smear Result Smear Available    I reviewed her peripheral smear and confirmed that she has adequate platelets.  ASSESSMENT & PLAN Thrombocytopenia I have reviewed her peripheral smear and confirmed that her low platelet count observed elsewhere is spurious due to clumping. I reassured  the patient that no further workup is needed.

## 2014-10-13 ENCOUNTER — Ambulatory Visit
Admission: RE | Admit: 2014-10-13 | Discharge: 2014-10-13 | Disposition: A | Discharge status: Home / Self Care | Payer: 59 | Source: Ambulatory Visit | Attending: Family Medicine | Admitting: Family Medicine

## 2014-10-13 DIAGNOSIS — Z1231 Encounter for screening mammogram for malignant neoplasm of breast: Secondary | ICD-10-CM

## 2014-10-13 DIAGNOSIS — M8580 Other specified disorders of bone density and structure, unspecified site: Secondary | ICD-10-CM

## 2014-10-27 ENCOUNTER — Ambulatory Visit: Payer: 59 | Admitting: Hematology and Oncology

## 2015-03-25 ENCOUNTER — Other Ambulatory Visit: Payer: Self-pay | Admitting: Orthopaedic Surgery

## 2015-03-25 DIAGNOSIS — M25552 Pain in left hip: Secondary | ICD-10-CM

## 2015-04-29 ENCOUNTER — Ambulatory Visit
Admission: RE | Admit: 2015-04-29 | Discharge: 2015-04-29 | Disposition: A | Discharge status: Home / Self Care | Payer: 59 | Source: Ambulatory Visit | Attending: Orthopaedic Surgery | Admitting: Orthopaedic Surgery

## 2015-04-29 DIAGNOSIS — M25552 Pain in left hip: Secondary | ICD-10-CM

## 2015-06-03 ENCOUNTER — Other Ambulatory Visit (HOSPITAL_COMMUNITY): Payer: Self-pay | Admitting: Orthopaedic Surgery

## 2015-06-07 ENCOUNTER — Encounter (HOSPITAL_COMMUNITY): Payer: Self-pay

## 2015-06-08 ENCOUNTER — Encounter (HOSPITAL_COMMUNITY)
Admission: RE | Admit: 2015-06-08 | Discharge: 2015-06-08 | Disposition: A | Discharge status: Home / Self Care | Payer: Commercial Managed Care - HMO | Source: Ambulatory Visit | Attending: Orthopaedic Surgery | Admitting: Orthopaedic Surgery

## 2015-06-08 ENCOUNTER — Encounter (HOSPITAL_COMMUNITY): Payer: Self-pay

## 2015-06-08 DIAGNOSIS — Z01810 Encounter for preprocedural cardiovascular examination: Secondary | ICD-10-CM | POA: Insufficient documentation

## 2015-06-08 DIAGNOSIS — M1612 Unilateral primary osteoarthritis, left hip: Secondary | ICD-10-CM | POA: Insufficient documentation

## 2015-06-08 DIAGNOSIS — Z01818 Encounter for other preprocedural examination: Secondary | ICD-10-CM | POA: Insufficient documentation

## 2015-06-08 HISTORY — DX: Repeated falls: R29.6

## 2015-06-08 HISTORY — DX: Other specified disorders of bone density and structure, unspecified site: M85.80

## 2015-06-08 LAB — APTT: aPTT: 26 seconds (ref 24–37)

## 2015-06-08 LAB — SURGICAL PCR SCREEN
MRSA, PCR: NEGATIVE
Staphylococcus aureus: NEGATIVE

## 2015-06-08 LAB — BASIC METABOLIC PANEL
Anion gap: 5 (ref 5–15)
BUN: 17 mg/dL (ref 6–20)
CALCIUM: 9.2 mg/dL (ref 8.9–10.3)
CHLORIDE: 108 mmol/L (ref 101–111)
CO2: 26 mmol/L (ref 22–32)
Creatinine, Ser: 0.73 mg/dL (ref 0.44–1.00)
GFR calc Af Amer: >60 mL/min (ref >60)
GFR calc non Af Amer: >60 mL/min (ref >60)
Glucose, Bld: 86 mg/dL (ref 65–99)
Potassium: 4.3 mmol/L (ref 3.5–5.1)
Sodium: 139 mmol/L (ref 135–145)

## 2015-06-08 LAB — PROTIME-INR
INR: 0.93 (ref 0.00–1.49)
Prothrombin Time: 12.7 seconds (ref 11.6–15.2)

## 2015-06-08 LAB — CBC
HCT: 43.1 % (ref 36.0–46.0)
Hemoglobin: 14.6 g/dL (ref 12.0–15.0)
MCH: 30 pg (ref 26.0–34.0)
MCHC: 33.9 g/dL (ref 30.0–36.0)
MCV: 88.7 fL (ref 78.0–100.0)
PLATELETS: 209 10*3/uL (ref 150–400)
RBC: 4.86 MIL/uL (ref 3.87–5.11)
RDW: 12.9 % (ref 11.5–15.5)
WBC: 4.9 10*3/uL (ref 4.0–10.5)

## 2015-06-08 LAB — ABO/RH: ABO/RH(D): A POS

## 2015-06-08 NOTE — Patient Instructions (Signed)
Lindsey Young  06/08/2015   Your procedure is scheduled on: Friday June 18, 2015  Report to [facility] Main  Entrance take [place]  elevators to 3rd floor to  [place] at 8:00 AM.  Call this number if you have problems the morning of surgery [phone]   Remember: ONLY 1 PERSON MAY GO WITH YOU TO SHORT STAY TO GET  READY MORNING OF [place].  Do not eat food or drink liquids :After Midnight.     Take these medicines the morning of surgery with A SIP OF WATER: NONE                               You may not have any metal on your body including hair pins and              piercings  Do not wear jewelry, make-up, lotions, powders or perfumes, deodorant             Do not wear nail polish.  Do not shave  48 hours prior to surgery.               Do not bring valuables to the hospital. [place].  Contacts, dentures or bridgework may not be worn into surgery.  Leave suitcase in the car. After surgery it may be brought to your room.                Please read over the following fact sheets you were given:MRSA INFORMATION SHEET _____________________________________________________________________             [facility] - Preparing for Surgery Before surgery, you can play an important role.  Because skin is not sterile, your skin needs to be as free of germs as possible.  You can reduce the number of germs on your skin by washing with CHG (chlorahexidine gluconate) soap before surgery.  CHG is an antiseptic cleaner which kills germs and bonds with the skin to continue killing germs even after washing. Please DO NOT use if you have an allergy to CHG or antibacterial soaps.  If your skin becomes reddened/irritated stop using the CHG and inform your nurse when you arrive at Short Stay. Do not shave (including legs and underarms) for at least 48 hours prior to the first CHG shower.  You may shave your  face/neck. Please follow these instructions carefully:  1.  Shower with CHG Soap the night before surgery and the  morning of Surgery.  2.  If you choose to wash your hair, wash your hair first as usual with your  normal  shampoo.  3.  After you shampoo, rinse your hair and body thoroughly to remove the  shampoo.                           4.  Use CHG as you would any other liquid soap.  You can apply chg directly  to the skin and wash                       Gently with a scrungie or clean washcloth.  5.  Apply the CHG Soap to your body ONLY FROM THE NECK  DOWN.   Do not use on face/ open                           Wound or open sores. Avoid contact with eyes, ears mouth and genitals (private parts).                       Wash face,  Genitals (private parts) with your normal soap.             6.  Wash thoroughly, paying special attention to the area where your surgery  will be performed.  7.  Thoroughly rinse your body with warm water from the neck down.  8.  DO NOT shower/wash with your normal soap after using and rinsing off  the CHG Soap.                9.  Pat yourself dry with a clean towel.            10.  Wear clean pajamas.            11.  Place clean sheets on your bed the night of your first shower and do not  sleep with pets. Day of Surgery : Do not apply any lotions/deodorants the morning of surgery.  Please wear clean clothes to the hospital/surgery center.  FAILURE TO FOLLOW THESE INSTRUCTIONS MAY RESULT IN THE CANCELLATION OF YOUR SURGERY PATIENT SIGNATURE_________________________________  NURSE SIGNATURE__________________________________  ________________________________________________________________________

## 2015-06-17 NOTE — Anesthesia Preprocedure Evaluation (Addendum)
Anesthesia Evaluation  Patient identified by MRN, date of birth, ID band Patient awake    Reviewed: Allergy & Precautions, NPO status , Patient's Chart, lab work & pertinent test results  History of Anesthesia Complications (+) PONV and history of anesthetic complications  Airway Mallampati: II  TM Distance: >3 FB Neck ROM: Full    Dental no notable dental hx. (+) Dental Advisory Given   Pulmonary former smoker,  breath sounds clear to auscultation  Pulmonary exam normal       Cardiovascular Normal cardiovascular exam+ dysrhythmias (hx of svt prior to menopause but not problems since) Rhythm:Regular Rate:Normal     Neuro/Psych negative neurological ROS  negative psych ROS   GI/Hepatic negative GI ROS, Neg liver ROS,   Endo/Other  negative endocrine ROS  Renal/GU negative Renal ROS  negative genitourinary   Musculoskeletal  (+) Arthritis -, Osteoarthritis,    Abdominal   Peds negative pediatric ROS (+)  Hematology negative hematology ROS (+)   Anesthesia Other Findings   Reproductive/Obstetrics negative OB ROS                            Anesthesia Physical Anesthesia Plan  ASA: II  Anesthesia Plan: Spinal   Post-op Pain Management:    Induction: Intravenous  Airway Management Planned:   Additional Equipment:   Intra-op Plan:   Post-operative Plan:   Informed Consent: I have reviewed the patients History and Physical, chart, labs and discussed the procedure including the risks, benefits and alternatives for the proposed anesthesia with the patient or authorized representative who has indicated his/her understanding and acceptance.   Dental advisory given  Plan Discussed with: CRNA  Anesthesia Plan Comments:        Anesthesia Quick Evaluation

## 2015-06-18 ENCOUNTER — Inpatient Hospital Stay (HOSPITAL_COMMUNITY)
Admission: RE | Admit: 2015-06-18 | Discharge: 2015-06-19 | DRG: 470 | Disposition: A | Discharge status: Home Health | Payer: Commercial Managed Care - HMO | Source: Ambulatory Visit | Attending: Orthopaedic Surgery | Admitting: Orthopaedic Surgery

## 2015-06-18 ENCOUNTER — Encounter (HOSPITAL_COMMUNITY): Payer: Self-pay | Admitting: Certified Registered Nurse Anesthetist

## 2015-06-18 ENCOUNTER — Inpatient Hospital Stay (HOSPITAL_COMMUNITY): Payer: Commercial Managed Care - HMO

## 2015-06-18 ENCOUNTER — Inpatient Hospital Stay (HOSPITAL_COMMUNITY): Payer: Commercial Managed Care - HMO | Admitting: Anesthesiology

## 2015-06-18 ENCOUNTER — Encounter (HOSPITAL_COMMUNITY)
Admission: RE | Disposition: A | Discharge status: Home Health | Payer: Self-pay | Source: Ambulatory Visit | Attending: Orthopaedic Surgery

## 2015-06-18 DIAGNOSIS — M1612 Unilateral primary osteoarthritis, left hip: Secondary | ICD-10-CM | POA: Diagnosis present

## 2015-06-18 DIAGNOSIS — Z87891 Personal history of nicotine dependence: Secondary | ICD-10-CM | POA: Diagnosis not present

## 2015-06-18 DIAGNOSIS — Z01812 Encounter for preprocedural laboratory examination: Secondary | ICD-10-CM

## 2015-06-18 DIAGNOSIS — M8580 Other specified disorders of bone density and structure, unspecified site: Secondary | ICD-10-CM | POA: Diagnosis present

## 2015-06-18 DIAGNOSIS — Z419 Encounter for procedure for purposes other than remedying health state, unspecified: Secondary | ICD-10-CM

## 2015-06-18 DIAGNOSIS — M25552 Pain in left hip: Secondary | ICD-10-CM | POA: Diagnosis present

## 2015-06-18 DIAGNOSIS — Z96642 Presence of left artificial hip joint: Secondary | ICD-10-CM

## 2015-06-18 HISTORY — PX: TOTAL HIP ARTHROPLASTY: SHX124

## 2015-06-18 LAB — TYPE AND SCREEN
ABO/RH(D): A POS
ANTIBODY SCREEN: NEGATIVE

## 2015-06-18 SURGERY — ARTHROPLASTY, HIP, TOTAL, ANTERIOR APPROACH
Anesthesia: Spinal | Laterality: Left

## 2015-06-18 MED ORDER — FENTANYL CITRATE (PF) 100 MCG/2ML IJ SOLN
INTRAMUSCULAR | Status: AC
  Filled 2015-06-18: qty 2

## 2015-06-18 MED ORDER — ASPIRIN EC 325 MG PO TBEC
325.0000 mg | DELAYED_RELEASE_TABLET | Freq: Two times a day (BID) | ORAL | Status: DC
  Administered 2015-06-19: 325 mg via ORAL
  Filled 2015-06-18 (×3): qty 1

## 2015-06-18 MED ORDER — METOCLOPRAMIDE HCL 10 MG PO TABS: 5.0000 mg | ORAL_TABLET | Freq: Three times a day (TID) | ORAL | Status: DC | PRN

## 2015-06-18 MED ORDER — ONDANSETRON HCL 4 MG PO TABS: 4.0000 mg | ORAL_TABLET | Freq: Four times a day (QID) | ORAL | Status: DC | PRN

## 2015-06-18 MED ORDER — FENTANYL CITRATE (PF) 100 MCG/2ML IJ SOLN: 25.0000 ug | INTRAMUSCULAR | Status: DC | PRN

## 2015-06-18 MED ORDER — ALUM & MAG HYDROXIDE-SIMETH 200-200-20 MG/5ML PO SUSP: 30.0000 mL | ORAL | Status: DC | PRN

## 2015-06-18 MED ORDER — PROPOFOL 10 MG/ML IV BOLUS
INTRAVENOUS | Status: AC
  Filled 2015-06-18: qty 20

## 2015-06-18 MED ORDER — SODIUM CHLORIDE 0.9 % IV SOLN
INTRAVENOUS | Status: DC
  Administered 2015-06-18: 14:00:00 via INTRAVENOUS

## 2015-06-18 MED ORDER — MIDAZOLAM HCL 5 MG/5ML IJ SOLN
INTRAMUSCULAR | Status: DC | PRN
  Administered 2015-06-18: 2 mg via INTRAVENOUS

## 2015-06-18 MED ORDER — PHENOL 1.4 % MT LIQD
1.0000 | OROMUCOSAL | Status: DC | PRN
  Filled 2015-06-18: qty 177

## 2015-06-18 MED ORDER — ACETAMINOPHEN 325 MG PO TABS
650.0000 mg | ORAL_TABLET | Freq: Four times a day (QID) | ORAL | Status: DC | PRN
Start: 2015-06-18 — End: 2015-06-19
  Administered 2015-06-18: 650 mg via ORAL
  Filled 2015-06-18: qty 2

## 2015-06-18 MED ORDER — BUPIVACAINE HCL (PF) 0.5 % IJ SOLN
INTRAMUSCULAR | Status: DC | PRN
  Administered 2015-06-18: 3 mL

## 2015-06-18 MED ORDER — ONDANSETRON HCL 4 MG/2ML IJ SOLN: 4.0000 mg | Freq: Once | INTRAMUSCULAR | Status: DC | PRN

## 2015-06-18 MED ORDER — ACETAMINOPHEN 650 MG RE SUPP: 650.0000 mg | Freq: Four times a day (QID) | RECTAL | Status: DC | PRN

## 2015-06-18 MED ORDER — DEXAMETHASONE SODIUM PHOSPHATE 10 MG/ML IJ SOLN
INTRAMUSCULAR | Status: DC | PRN
  Administered 2015-06-18: 10 mg via INTRAVENOUS

## 2015-06-18 MED ORDER — MENTHOL 3 MG MT LOZG
1.0000 | LOZENGE | OROMUCOSAL | Status: DC | PRN
  Filled 2015-06-18: qty 9

## 2015-06-18 MED ORDER — DOCUSATE SODIUM 100 MG PO CAPS
100.0000 mg | ORAL_CAPSULE | Freq: Two times a day (BID) | ORAL | Status: DC
  Administered 2015-06-18 – 2015-06-19 (×2): 100 mg via ORAL
  Filled 2015-06-18 (×3): qty 1

## 2015-06-18 MED ORDER — FENTANYL CITRATE (PF) 250 MCG/5ML IJ SOLN
INTRAMUSCULAR | Status: DC | PRN
  Administered 2015-06-18: 100 ug via INTRAVENOUS

## 2015-06-18 MED ORDER — METHOCARBAMOL 500 MG PO TABS: 500.0000 mg | ORAL_TABLET | Freq: Four times a day (QID) | ORAL | Status: DC | PRN

## 2015-06-18 MED ORDER — OXYCODONE HCL 5 MG PO TABS
5.0000 mg | ORAL_TABLET | ORAL | Status: DC | PRN
  Filled 2015-06-18: qty 1

## 2015-06-18 MED ORDER — CEFAZOLIN SODIUM 1-5 GM-% IV SOLN
1.0000 g | Freq: Four times a day (QID) | INTRAVENOUS | Status: AC
  Administered 2015-06-18 (×2): 1 g via INTRAVENOUS
  Filled 2015-06-18 (×2): qty 50

## 2015-06-18 MED ORDER — CEFAZOLIN SODIUM-DEXTROSE 2-3 GM-% IV SOLR
2.0000 g | INTRAVENOUS | Status: AC
  Administered 2015-06-18: 2 g via INTRAVENOUS

## 2015-06-18 MED ORDER — DEXTROSE 5 % IV SOLN
500.0000 mg | Freq: Four times a day (QID) | INTRAVENOUS | Status: DC | PRN
  Filled 2015-06-18: qty 5

## 2015-06-18 MED ORDER — CEFAZOLIN SODIUM-DEXTROSE 2-3 GM-% IV SOLR
INTRAVENOUS | Status: AC
  Filled 2015-06-18: qty 50

## 2015-06-18 MED ORDER — MIDAZOLAM HCL 2 MG/2ML IJ SOLN
INTRAMUSCULAR | Status: AC
  Filled 2015-06-18: qty 2

## 2015-06-18 MED ORDER — HYDROMORPHONE HCL 1 MG/ML IJ SOLN: 1.0000 mg | INTRAMUSCULAR | Status: DC | PRN

## 2015-06-18 MED ORDER — DIPHENHYDRAMINE HCL 12.5 MG/5ML PO ELIX
12.5000 mg | ORAL_SOLUTION | ORAL | Status: DC | PRN
Start: 2015-06-18 — End: 2015-06-19

## 2015-06-18 MED ORDER — METOCLOPRAMIDE HCL 5 MG/ML IJ SOLN: 5.0000 mg | Freq: Three times a day (TID) | INTRAMUSCULAR | Status: DC | PRN

## 2015-06-18 MED ORDER — PROPOFOL INFUSION 10 MG/ML OPTIME
INTRAVENOUS | Status: DC | PRN
  Administered 2015-06-18: 75 ug/kg/min via INTRAVENOUS

## 2015-06-18 MED ORDER — TRANEXAMIC ACID 1000 MG/10ML IV SOLN
1000.0000 mg | INTRAVENOUS | Status: AC
  Administered 2015-06-18: 1000 mg via INTRAVENOUS
  Filled 2015-06-18: qty 10

## 2015-06-18 MED ORDER — KETOROLAC TROMETHAMINE 15 MG/ML IJ SOLN
15.0000 mg | Freq: Four times a day (QID) | INTRAMUSCULAR | Status: DC
Start: 2015-06-18 — End: 2015-06-19
  Administered 2015-06-18 – 2015-06-19 (×4): 15 mg via INTRAVENOUS
  Filled 2015-06-18 (×8): qty 1

## 2015-06-18 MED ORDER — TRAMADOL HCL 50 MG PO TABS
100.0000 mg | ORAL_TABLET | Freq: Four times a day (QID) | ORAL | Status: DC | PRN
  Administered 2015-06-18 – 2015-06-19 (×2): 100 mg via ORAL
  Filled 2015-06-18 (×2): qty 2

## 2015-06-18 MED ORDER — ONDANSETRON HCL 4 MG/2ML IJ SOLN: 4.0000 mg | Freq: Four times a day (QID) | INTRAMUSCULAR | Status: DC | PRN

## 2015-06-18 MED ORDER — ONDANSETRON HCL 4 MG/2ML IJ SOLN
INTRAMUSCULAR | Status: DC | PRN
  Administered 2015-06-18: 4 mg via INTRAVENOUS

## 2015-06-18 MED ORDER — ZOLPIDEM TARTRATE 5 MG PO TABS: 5.0000 mg | ORAL_TABLET | Freq: Every evening | ORAL | Status: DC | PRN

## 2015-06-18 MED ORDER — LACTATED RINGERS IV SOLN
INTRAVENOUS | Status: DC
  Administered 2015-06-18: 11:00:00 via INTRAVENOUS
  Administered 2015-06-18: 1000 mL via INTRAVENOUS
  Administered 2015-06-18: 09:00:00 via INTRAVENOUS

## 2015-06-18 MED ORDER — VITAMIN D3 25 MCG (1000 UNIT) PO TABS
1000.0000 [IU] | ORAL_TABLET | Freq: Every day | ORAL | Status: DC
  Administered 2015-06-18 – 2015-06-19 (×2): 1000 [IU] via ORAL
  Filled 2015-06-18 (×2): qty 1

## 2015-06-18 SURGICAL SUPPLY — 45 items
APL SKNCLS STERI-STRIP NONHPOA (GAUZE/BANDAGES/DRESSINGS) ×1
BAG SPEC THK2 15X12 ZIP CLS (MISCELLANEOUS)
BAG ZIPLOCK 12X15 (MISCELLANEOUS) IMPLANT
BENZOIN TINCTURE PRP APPL 2/3 (GAUZE/BANDAGES/DRESSINGS) ×1 IMPLANT
BLADE SAW SGTL 18X1.27X75 (BLADE) ×2 IMPLANT
CAPT HIP TOTAL 2 ×1 IMPLANT
CELLS DAT CNTRL 66122 CELL SVR (MISCELLANEOUS) ×1 IMPLANT
COVER PERINEAL POST (MISCELLANEOUS) ×2 IMPLANT
DRAPE C-ARM 42X120 X-RAY (DRAPES) ×2 IMPLANT
DRAPE STERI IOBAN 125X83 (DRAPES) ×2 IMPLANT
DRAPE U-SHAPE 47X51 STRL (DRAPES) ×6 IMPLANT
DRSG AQUACEL AG ADV 3.5X10 (GAUZE/BANDAGES/DRESSINGS) ×2 IMPLANT
DURAPREP 26ML APPLICATOR (WOUND CARE) ×2 IMPLANT
ELECT BLADE TIP CTD 4 INCH (ELECTRODE) ×2 IMPLANT
ELECT REM PT RETURN 9FT ADLT (ELECTROSURGICAL) ×2
ELECTRODE REM PT RTRN 9FT ADLT (ELECTROSURGICAL) ×1 IMPLANT
FACESHIELD WRAPAROUND (MASK) ×8 IMPLANT
FACESHIELD WRAPAROUND OR TEAM (MASK) ×4 IMPLANT
GAUZE XEROFORM 1X8 LF (GAUZE/BANDAGES/DRESSINGS) ×1 IMPLANT
GLOVE BIO SURGEON STRL SZ7.5 (GLOVE) ×2 IMPLANT
GLOVE BIOGEL PI IND STRL 8 (GLOVE) ×2 IMPLANT
GLOVE BIOGEL PI INDICATOR 8 (GLOVE) ×2
GLOVE ECLIPSE 8.0 STRL XLNG CF (GLOVE) ×2 IMPLANT
GOWN STRL REUS W/TWL XL LVL3 (GOWN DISPOSABLE) ×4 IMPLANT
HANDPIECE INTERPULSE COAX TIP (DISPOSABLE) ×2
KIT BASIN OR (CUSTOM PROCEDURE TRAY) ×2 IMPLANT
NS IRRIG 1000ML POUR BTL (IV SOLUTION) ×1 IMPLANT
PACK TOTAL JOINT (CUSTOM PROCEDURE TRAY) ×2 IMPLANT
PEN SKIN MARKING BROAD (MISCELLANEOUS) ×2 IMPLANT
RETRACTOR WND ALEXIS 18 MED (MISCELLANEOUS) ×1 IMPLANT
RTRCTR WOUND ALEXIS 18CM MED (MISCELLANEOUS) ×2
SET HNDPC FAN SPRY TIP SCT (DISPOSABLE) ×1 IMPLANT
STAPLER VISISTAT 35W (STAPLE) IMPLANT
STRIP CLOSURE SKIN 1/2X4 (GAUZE/BANDAGES/DRESSINGS) ×1 IMPLANT
SUT ETHIBOND NAB CT1 #1 30IN (SUTURE) ×2 IMPLANT
SUT MNCRL AB 4-0 PS2 18 (SUTURE) IMPLANT
SUT VIC AB 0 CT1 36 (SUTURE) ×2 IMPLANT
SUT VIC AB 1 CT1 36 (SUTURE) ×2 IMPLANT
SUT VIC AB 2-0 CT1 27 (SUTURE) ×4
SUT VIC AB 2-0 CT1 TAPERPNT 27 (SUTURE) ×2 IMPLANT
TOWEL OR 17X26 10 PK STRL BLUE (TOWEL DISPOSABLE) ×2 IMPLANT
TOWEL OR NON WOVEN STRL DISP B (DISPOSABLE) ×2 IMPLANT
TRAY FOLEY W/METER SILVER 14FR (SET/KITS/TRAYS/PACK) ×2 IMPLANT
WATER STERILE IRR 500ML POUR (IV SOLUTION) ×2 IMPLANT
YANKAUER SUCT BULB TIP 10FT TU (MISCELLANEOUS) ×2 IMPLANT

## 2015-06-18 NOTE — Care Management Note (Signed)
Case Management Note  Patient Details  Name: Lindsey Young MRN: [id number] Date of Birth: June 07, 1951  Subjective/Objective:     64 y.o. F admitted for elective L THA AA. Lives in private residence with spouse. Anticipate Home with HHPT. Will follow for PT recommendations.               Action/Plan: Follow for PT recommendations.    Expected Discharge Date:  06/19/15               Expected Discharge Plan:  [place]  In-House Referral:     Discharge planning Services  CM Consult  Post Acute Care Choice:    Choice offered to:     DME Arranged:    DME Agency:     HH Arranged:    HH Agency:     Status of Service:  In process, will continue to follow  Medicare Important Message Given:    Date Medicare IM Given:    Medicare IM give by:    Date Additional Medicare IM Given:    Additional Medicare Important Message give by:     If discussed at [place] of Stay Meetings, dates discussed:    Additional Comments:  Delrae Sawyers, RN 06/18/2015, 2:58 PM

## 2015-06-18 NOTE — Transfer of Care (Signed)
Immediate Anesthesia Transfer of Care Note  Patient: Lindsey Young  Procedure(s) Performed: Procedure(s): LEFT TOTAL HIP ARTHROPLASTY ANTERIOR APPROACH (Left)  Patient Location: PACU  Anesthesia Type:Spinal  Level of Consciousness: awake, alert  and oriented  Airway & Oxygen Therapy: Patient Spontanous Breathing and Patient connected to face mask oxygen  Post-op Assessment: Report given to RN and Post -op Vital signs reviewed and stable  Post vital signs: Reviewed and stable  Last Vitals:  Filed Vitals:   06/18/15 0808  BP: 119/55  Pulse: 52  Temp: 36.4 C  Resp: 18    Complications: No apparent anesthesia complications

## 2015-06-18 NOTE — Anesthesia Postprocedure Evaluation (Signed)
  Anesthesia Post-op Note  Patient: Lindsey Young  Procedure(s) Performed: Procedure(s) (LRB): LEFT TOTAL HIP ARTHROPLASTY ANTERIOR APPROACH (Left)  Patient Location: PACU  Anesthesia Type: Spinal  Level of Consciousness: awake and alert   Airway and Oxygen Therapy: Patient Spontanous Breathing  Post-op Pain: mild  Post-op Assessment: Post-op Vital signs reviewed, Patient's Cardiovascular Status Stable, Respiratory Function Stable, Patent Airway and No signs of Nausea or vomiting  Last Vitals:  Filed Vitals:   06/18/15 1658  BP: 104/34  Pulse: 50  Temp: 36.6 C  Resp: 16    Post-op Vital Signs: stable   Complications: No apparent anesthesia complications

## 2015-06-18 NOTE — H&P (Signed)
TOTAL HIP ADMISSION H&P  Patient is admitted for left total hip arthroplasty.  Subjective:  Chief Complaint: left hip pain  HPI: Lindsey Young, 64 y.o. female, has a history of pain and functional disability in the left hip(s) due to arthritis and patient has failed non-surgical conservative treatments for greater than 12 weeks to include NSAID's and/or analgesics, corticosteriod injections, flexibility and strengthening excercises, use of assistive devices and activity modification.  Onset of symptoms was abrupt starting 1 years ago with gradually worsening course since that time.The patient noted no past surgery on the left hip(s).  Patient currently rates pain in the left hip at 10 out of 10 with activity. Patient has night pain, worsening of pain with activity and weight bearing, pain that interfers with activities of daily living and pain with passive range of motion. Patient has evidence of subchondral sclerosis, periarticular osteophytes and joint space narrowing by imaging studies. This condition presents safety issues increasing the risk of falls.  There is no current active infection.  Patient Active Problem List   Diagnosis Date Noted  . Osteoarthritis of left hip 06/18/2015  . Thrombocytopenia 10/12/2014  . Arthritis 10/12/2014  . OSTEOARTHRITIS, HIPS, BILATERAL 08/25/2008  . FRACTURE, ANKLE, RIGHT 08/25/2008   Past Medical History  Diagnosis Date  . H/O thyroid cyst   . Arthritis     bilateral hips  . PONV (postoperative nausea and vomiting)   . H/O cardiovascular stress test     seen by Dr. Linard Millers for SVT- (2007 approx.), then referred to Dr. Caryl Comes, , told not a candidate for ablation.   . Thrombocytopenia 10/12/2014  . Arthritis 10/12/2014  . 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 Osteopenia   . Falls frequently     pt states has had 10 broken bones in life time    Past Surgical History  Procedure Laterality Date  . Appendectomy    .  Colonoscopy    . Orif patella Left 08/21/2013    Procedure: OPEN REDUCTION INTERNAL (ORIF) OF A LEFT PATELLA FRACTURE/POSSIBLE LEFT PARTIAL PATELLALECTOMY;  Surgeon: Marin Shutter, MD;  Location: [place];  Service: Orthopedics;  Laterality: Left;  Marland Kitchen Eye surgery      bilat cataract surgery   . Fracture surgery      trimalleallor fracture, foot & ankle fixation   . Foot pinned      left   . Dilation and curettage of uterus      secondary to polyps    No prescriptions prior to admission   Allergies  Allergen Reactions  . Codeine Nausea And Vomiting    Make pain WORSE    History  Substance Use Topics  . Smoking status: Former Smoker -- 0.25 packs/day for 20 years    Types: Cigarettes    Quit date: 11/20/1993  . Smokeless tobacco: Never Used  . Alcohol Use: 4.2 oz/week    7 Glasses of wine per week     Comment: 7 glasses / week    Family History  Problem Relation Age of Onset  . Cancer Father     liver cancer, DVT  . Cancer Brother     DVT and PE     Review of Systems  Musculoskeletal: Positive for joint pain.  All other systems reviewed and are negative.   Objective:  Physical Exam  Constitutional: She is oriented to person, place, and time. She appears well-developed and well-nourished.  HENT:  Head: Normocephalic  and atraumatic.  Eyes: EOM are normal. Pupils are equal, round, and reactive to light.  Neck: Normal range of motion. Neck supple.  Cardiovascular: Normal rate and regular rhythm.   Respiratory: Effort normal and breath sounds normal.  GI: Soft. Bowel sounds are normal.  Musculoskeletal:       Left hip: She exhibits decreased range of motion, decreased strength, tenderness and bony tenderness.  Neurological: She is alert and oriented to person, place, and time.  Skin: Skin is warm and dry.  Psychiatric: She has a normal mood and affect.    Vital signs in last 24 hours:    Labs:   Estimated body mass index is 23.36 kg/(m^2) as calculated from the  following:   Height as of 10/12/14: 5' 8.5" (1.74 m).   Weight as of 10/12/14: 70.716 kg (155 lb 14.4 oz).   Imaging Review Plain radiographs demonstrate severe degenerative joint disease of the left hip(s). The bone quality appears to be good for age and reported activity level.  Assessment/Plan:  End stage arthritis, left hip(s)  The patient history, physical examination, clinical judgement of the provider and imaging studies are consistent with end stage degenerative joint disease of the left hip(s) and total hip arthroplasty is deemed medically necessary. The treatment options including medical management, injection therapy, arthroscopy and arthroplasty were discussed at length. The risks and benefits of total hip arthroplasty were presented and reviewed. The risks due to aseptic loosening, infection, stiffness, dislocation/subluxation,  thromboembolic complications and other imponderables were discussed.  The patient acknowledged the explanation, agreed to proceed with the plan and consent was signed. Patient is being admitted for inpatient treatment for surgery, pain control, PT, OT, prophylactic antibiotics, VTE prophylaxis, progressive ambulation and ADL's and discharge planning.The patient is planning to be discharged home with home health services

## 2015-06-18 NOTE — Brief Op Note (Signed)
06/18/2015  11:37 AM  PATIENT:  Lindsey Young  64 y.o. female  PRE-OPERATIVE DIAGNOSIS:  Severe osteoarthritis left hip  POST-OPERATIVE DIAGNOSIS:  Severe osteoarthritis left hip  PROCEDURE:  Procedure(s): LEFT TOTAL HIP ARTHROPLASTY ANTERIOR APPROACH (Left)  SURGEON:  Surgeon(s) and Role:    * Mcarthur Rossetti, MD - Primary  PHYSICIAN ASSISTANT: Benita Stabile, PA-C  ANESTHESIA:   spinal  EBL:  Total I/O In: 1600 [I.V.:1600] Out: 650 [Urine:400; Blood:250]  BLOOD ADMINISTERED:none  DRAINS: none   LOCAL MEDICATIONS USED:  NONE  SPECIMEN:  No Specimen  DISPOSITION OF SPECIMEN:  N/A  COUNTS:  YES  TOURNIQUET:  * No tourniquets in log *  DICTATION: .Other Dictation: Dictation Number [id number]  PATIENT DISPOSITION:  PACU - hemodynamically stable.   Delay start of Pharmacological VTE agent (>24hrs) due to surgical blood loss or risk of bleeding: not applicable

## 2015-06-18 NOTE — Anesthesia Procedure Notes (Signed)
Spinal Patient location during procedure: OR Start time: 06/18/2015 10:00 AM Staffing Anesthesiologist: Lauretta Grill Resident/CRNA: [place] Richardine Peppers C Performed by: resident/CRNA  Preanesthetic Checklist Completed: patient identified, site marked, surgical consent, pre-op evaluation, timeout performed, IV checked, risks and benefits discussed and monitors and equipment checked Spinal Block Patient position: sitting Prep: Betadine and site prepped and draped Patient monitoring: continuous pulse ox, blood pressure, cardiac monitor and heart rate Approach: midline Location: L3-4 Injection technique: single-shot Needle Needle type: Sprotte  Needle gauge: 24 G Needle length: 10 cm Assessment Sensory level: T6

## 2015-06-19 LAB — BASIC METABOLIC PANEL
ANION GAP: 7 (ref 5–15)
BUN: 15 mg/dL (ref 6–20)
CHLORIDE: 108 mmol/L (ref 101–111)
CO2: 24 mmol/L (ref 22–32)
Calcium: 8.2 mg/dL — ABNORMAL LOW (ref 8.9–10.3)
Creatinine, Ser: 0.61 mg/dL (ref 0.44–1.00)
GFR calc non Af Amer: >60 mL/min (ref >60)
Glucose, Bld: 113 mg/dL — ABNORMAL HIGH (ref 65–99)
Potassium: 4.7 mmol/L (ref 3.5–5.1)
Sodium: 139 mmol/L (ref 135–145)

## 2015-06-19 LAB — CBC
HCT: 37.2 % (ref 36.0–46.0)
Hemoglobin: 12.5 g/dL (ref 12.0–15.0)
MCH: 29.8 pg (ref 26.0–34.0)
MCHC: 33.6 g/dL (ref 30.0–36.0)
MCV: 88.6 fL (ref 78.0–100.0)
Platelets: 189 10*3/uL (ref 150–400)
RBC: 4.2 MIL/uL (ref 3.87–5.11)
RDW: 12.9 % (ref 11.5–15.5)
WBC: 9.3 10*3/uL (ref 4.0–10.5)

## 2015-06-19 MED ORDER — ASPIRIN 325 MG PO TBEC: 325.0000 mg | DELAYED_RELEASE_TABLET | Freq: Two times a day (BID) | ORAL | Status: DC

## 2015-06-19 MED ORDER — METHOCARBAMOL 500 MG PO TABS: 500.0000 mg | ORAL_TABLET | Freq: Four times a day (QID) | ORAL | Status: DC | PRN

## 2015-06-19 MED ORDER — TRAMADOL HCL 50 MG PO TABS: 100.0000 mg | ORAL_TABLET | Freq: Four times a day (QID) | ORAL | Status: DC | PRN

## 2015-06-19 MED ORDER — DOCUSATE SODIUM 100 MG PO CAPS
100.0000 mg | ORAL_CAPSULE | Freq: Two times a day (BID) | ORAL | Status: DC
Start: 2015-06-19 — End: 2017-05-30

## 2015-06-19 NOTE — Discharge Instructions (Signed)

## 2015-06-19 NOTE — Progress Notes (Signed)
Discharged from floor via w/c, belongings & spouse with pt. No changes in assessment. Gildo Crisco, [employer]

## 2015-06-19 NOTE — Progress Notes (Signed)
Contacted [facility] to make aware pt's went with [facility] for [facility]. Jonnie Finner RN CCM Case Mgmt phone [phone]  See previous NCM note on 06/19/2015

## 2015-06-19 NOTE — Op Note (Signed)
NAMESAAYA, PROCELL NO.:  [id number]  MEDICAL RECORD NO.:  [id number]  LOCATION:  [facility]                         FACILITY:  [facility]  PHYSICIAN:  Lind Guest. Ninfa Linden, M.D.DATE OF BIRTH:  1951/04/08  DATE OF PROCEDURE:  06/18/2015 DATE OF DISCHARGE:                              OPERATIVE REPORT   PREOPERATIVE DIAGNOSES:  Primary osteoarthritis and degenerative joint disease, left hip.  POSTOPERATIVE DIAGNOSES:  Primary osteoarthritis and degenerative joint disease, left hip.  PROCEDURE:  Left total hip arthroplasty, direct anterior approach.  IMPLANTS:  DePuy Sector Gription acetabular component size 52, size 36+0 neutral polyethylene liner, size 12 Corail femoral component with standard offset, size 36- 2 metal hip ball.  SURGEON:  Lind Guest. Ninfa Linden, M.D.  ASSISTANT:  Erskine Emery, PA-C.  ANESTHESIA:  Spinal.  ANTIBIOTICS:  2 g IV Ancef.  BLOOD LOSS:  Less than 500 mL.  COMPLICATIONS:  None.  INDICATIONS:  Ms. Oldfield is a 64 year old female, well known to me.  She has had debilitating left hip pain for some time now.  Her x-rays did show joint space narrowing and some sclerotic changes.  There were periarticular osteophytes.  Hip injection intra-articular with steroid did help ease her pain.  Due to the severity of her pain, we did obtain a CT scan of her hip that did confirm severity of her arthritis.  At this point with the failure of conservative treatment, decreased mobility she has had, and terrible impact that she has had on her quality of life and her activities of daily living, she does wish to proceed with a total hip arthroplasty.  She understands the risk of acute blood loss anemia, nerve and vessel injury, fracture, infection, dislocation, DVT.  She understands her goals are decreased pain, improved mobility, and overall improved quality of life.  DESCRIPTION OF PROCEDURE:  After informed consent was obtained, appropriate left  hip was marked.  She was brought to the operating room while she was on a stretcher.  Spinal anesthesia was obtained.  She was then laid in the supine position on the stretcher.  Foley catheter was placed, and both feet had traction boots applied to them.  Next, she was placed supine on the Hana fracture table with the perineal post in place and both legs in inline skeletal traction devices, but no traction applied.  Her left operative hip was prepped and draped with DuraPrep and sterile drapes.  Time-out was called and she was identified as correct patient, correct left hip.  We then made an incision inferior and posterior to the anterior superior iliac spine and carried this obliquely down the leg.  We dissected down tensor fascia lata muscle and tensor fascia was then divided longitudinally, so we could proceed with a direct anterior approach to the hip.  We identified and cauterized the lateral femoral circumflex vessels and identified the hip capsule.  We placed Cobar retractor temporarily on the lateral, medial femoral neck outside of the capsule.  We then opened up her hip capsule in a tight format, finding a moderate joint effusion and periarticular osteophytes. We placed a Cobra retractor within the joint capsule.  I then made our femoral neck cut with an  oscillating saw proximal to the lesser trochanter and completed this with an osteotome.  We placed a corkscrew guide in the femoral head and removed the femoral head in its entirety and found it to be devoid of cartilage.  I then cleaned the acetabulum remnants of the acetabular labrum and placed a bent Hohmann over the medial acetabular rim.  We then began reaming under direct visualization from a size 42, all the way up to a size 52 with last 2 reamers 50 and 52 placed under direct fluoroscopy, so we could obtain our depth of reamer, inclination, and anteversion.  Once we were pleased with this, we placed the real DePuy Sector  Gription acetabular component size 52, and the real 36+ 0 neutral polyethylene liner for that size acetabular component.  Attention was then turned to the femur with the leg externally rotated to 100 degrees, extended, and adducted.  We were able to place a Mueller retractor over the medial femur and Hohmann retractor behind the greater trochanter.  I released the joint capsule and used a box cutting osteotome to enter the femoral canal and a rongeur to lateralize.  We then began broaching from size 8, broached all the way up to size 12.  With the 12 nice and tight, we used the calcar planed off this and standard neck, and I decided to go with 36- 2 hip ball given her size and wanted to try to maximize being able to accomplish her offset and leg length compared to the other side which has no disease.  We brought the leg back over and up.  With traction and internal rotation, reducing the pelvis, we were pleased completely with range of motion, offset, and stability as well as leg length.  We then dislocated the hip and removed the trial components.  We placed the real Corail femoral component size 12 with standard offset and real 36- 2 metal hip ball and reduced this back in the acetabulum.  Again, it was stable.  We copiously irrigated the soft tissues with normal saline solution and closed the joint capsule with #1 Ethibond suture.  I did notice the tip of the needle had broken off and that may have gotten lost in suction.  I did have the C-arm unit still, prepped and draped and so we looked under fluoroscopy to see if we could identify the tip of the needle in the soft tissues or the joint and all, we could not see it whatsoever, so we were confident that it was not in the soft tissue. We then closed the joint capsule with interrupted #1 Ethibond suture, followed by running #1 Vicryl in the tensor fascia, 0 Vicryl in the deep tissue, 2-0 Vicryl in subcutaneous tissue, 4-0 Monocryl  subcuticular stitch and Steri-Strips on the skin.  Xeroform, well-padded sterile dressing, and Aquacel dressing were applied.  She was then taken off the Hana table and taken to the recovery room in stable condition.  All final counts were correct.  There were no complications noted.  Of note, Erskine Emery, PA-C, assisted in the entire case and his assistance was crucial for facilitating all aspects of this case.     Lind Guest. Ninfa Linden, M.D.     CYB/MEDQ  D:  06/18/2015  T:  06/18/2015  Job:  [id number]

## 2015-06-19 NOTE — Care Management Note (Signed)
Case Management Note  Patient Details  Name: Lindsey Young MRN: [id number] Date of Birth: 1951-01-12  Subjective/Objective:      Left total hip arthroplasty              Action/Plan: Home Health  Expected Discharge Date:  06/19/15               Expected Discharge Plan:  [place]  In-House Referral:     Discharge planning Services  CM Consult  Post Acute Care Choice:  Home Health Choice offered to:  Patient      HH Arranged:  PT [place] Agency:  [facility]  Status of Service:  Completed, signed off  Medicare Important Message Given:    Date Medicare IM Given:    Medicare IM give by:    Date Additional Medicare IM Given:    Additional Medicare Important Message give by:     If discussed at [place] of Stay Meetings, dates discussed:    Additional Comments: NCM spoke to pt and offered choice for [facility] Pt requested [facility] for HH. Pt states her husband had [facility] in the past for [facility]. She has RW at home. Contacted [facility] with new referral for [facility] for scheduled dc home today.  Erenest Rasher, RN 06/19/2015, 10:23 AM

## 2015-06-19 NOTE — Progress Notes (Signed)
Subjective: 1 Day Post-Op Procedure(s) (LRB): LEFT TOTAL HIP ARTHROPLASTY ANTERIOR APPROACH (Left) Patient reports pain as mild.   No complaints. Want to go home today.  Objective: Vital signs in last 24 hours: Temp:  [97.5 F (36.4 C)-98.6 F (37 C)] 98 F (36.7 C) (07/30 0605) Pulse Rate:  [41-64] 64 (07/30 0605) Resp:  [10-17] 14 (07/30 0605) BP: (102-119)/(34-65) 107/53 mmHg (07/30 0605) SpO2:  [96 %-100 %] 100 % (07/30 0605) Weight:  [69.854 kg (154 lb)] 69.854 kg (154 lb) (07/29 1345)  Intake/Output from previous day: 07/29 0701 - 07/30 0700 In: 4181.6 [P.O.:1100; I.V.:3031.6; IV Piggyback:50] Out: 2220 [Urine:1970; Blood:250] Intake/Output this shift:     Recent Labs  06/19/15 0553  HGB 12.5    Recent Labs  06/19/15 0553  WBC 9.3  RBC 4.20  HCT 37.2  PLT 189    Recent Labs  06/19/15 0553  NA 139  K 4.7  CL 108  CO2 24  BUN 15  CREATININE 0.61  GLUCOSE 113*  CALCIUM 8.2*   No results for input(s): LABPT, INR in the last 72 hours.   Left lowe extremity:  Sensation intact distally Intact pulses distally Dorsiflexion/Plantar flexion intact Incision: scant drainage Compartment soft  Assessment/Plan: 1 Day Post-Op Procedure(s) (LRB): LEFT TOTAL HIP ARTHROPLASTY ANTERIOR APPROACH (Left) Up with therapy  Possible discharge to home today if does well with PT.  Erskine Emery 06/19/2015, 8:13 AM

## 2015-06-19 NOTE — Evaluation (Signed)
  Occupational Therapy Evaluation Patient Details Name: Lindsey Young MRN: [id number] DOB: Oct 21, 1951 Today's Date: 09-Jul-2015    History of Present Illness LTHA   Clinical Impression   OT education complete    Follow Up Recommendations  No OT follow up    Equipment Recommendations  None recommended by OT       Precautions / Restrictions Precautions Precautions: None Restrictions Weight Bearing Restrictions: No      Mobility Bed Mobility               General bed mobility comments: pt in chair  Transfers Overall transfer level: Needs assistance Equipment used: Rolling walker (2 wheeled) Transfers: Sit to/from Stand Sit to Stand: Supervision         General transfer comment: VC for safety and hand placement         ADL Overall ADL's : Needs assistance/impaired                                       General ADL Comments: Pt overall S- min A with ADL activity. Husband will A as needed.  OT education complete regarding bathing, dressing, toileting and shower transfer               Pertinent Vitals/Pain Pain Assessment: 0-10 Pain Score: 3  Pain Location: L hip sore Pain Intervention(s): Monitored during session     Hand Dominance     Extremity/Trunk Assessment Upper Extremity Assessment Upper Extremity Assessment: Overall WFL for tasks assessed           Communication Communication Communication: No difficulties   Cognition Arousal/Alertness: Awake/alert Behavior During Therapy: WFL for tasks assessed/performed Overall Cognitive Status: Within Functional Limits for tasks assessed                     General Comments               Home Living Family/patient expects to be discharged to:: Private residence Living Arrangements: Spouse/significant other Available Help at Discharge: Family Type of Home: House Home Access: Stairs to enter (1 step)   Entrance Stairs-Rails: None Home Layout: One level     Bathroom Shower/Tub: Occupational psychologist: Handicapped height Bathroom Accessibility: Yes   Home Equipment: Environmental consultant - 2 wheels;Shower seat;Cane - quad;Crutches;Wheelchair - manual          Prior Functioning/Environment Level of Independence: Independent             OT Diagnosis:     OT Problem List:     OT Treatment/Interventions:      OT Goals(Current goals can be found in the care plan section) Acute Rehab OT Goals Patient Stated Goal: go home this day  OT Frequency:                End of Session    Activity Tolerance: Patient tolerated treatment well Patient left: in chair   Time: 3329-5188 OT Time Calculation (min): 15 min Charges:  OT General Charges $OT Visit: 1 Procedure OT Evaluation $Initial OT Evaluation Tier I: 1 Procedure G-Codes:    Betsy Pries Jul 09, 2015, 10:13 AM

## 2015-06-19 NOTE — Plan of Care (Signed)
Problem: Discharge Progression Outcomes Goal: Anticoagulant follow-up in place Outcome: Not Applicable Date Met:  61/25/48 asa

## 2015-06-19 NOTE — Evaluation (Signed)
Physical Therapy Evaluation Patient Details Name: Lindsey Young MRN: [id number] DOB: 06/29/1951 Today's Date: 06/19/2015   History of Present Illness  LTHA  Clinical Impression  Pt s/p L: THR presents with decreased L LE strength/ROM and post op pain limiting functional mobility.  Pt should progress well to dc home with family assist and HHPT follow up.      Follow Up Recommendations Home health PT    Equipment Recommendations  None recommended by PT    Recommendations for Other Services OT consult     Precautions / Restrictions Precautions Precautions: None Restrictions Weight Bearing Restrictions: No Other Position/Activity Restrictions: WBAT      Mobility  Bed Mobility Overal bed mobility: Needs Assistance Bed Mobility: Supine to Sit     Supine to sit: Min assist     General bed mobility comments: pt in chair  Transfers Overall transfer level: Needs assistance Equipment used: Rolling walker (2 wheeled) Transfers: Sit to/from Stand Sit to Stand: Supervision         General transfer comment: VC for safety and hand placement  Ambulation/Gait Ambulation/Gait assistance: Min assist;Min guard Ambulation Distance (Feet): 180 Feet Assistive device: Rolling walker (2 wheeled) Gait Pattern/deviations: Step-to pattern;Step-through pattern;Decreased step length - right;Decreased step length - left;Shuffle     General Gait Details: cues for posture, position from RW and initial sequence  Stairs            Wheelchair Mobility    Modified Rankin (Stroke Patients Only)       Balance                                             Pertinent Vitals/Pain Pain Assessment: 0-10 Pain Score: 3  Pain Location: L hip sore Pain Descriptors / Indicators: Sore Pain Intervention(s): Monitored during session    Home Living Family/patient expects to be discharged to:: Private residence Living Arrangements: Spouse/significant other Available  Help at Discharge: Family Type of Home: House Home Access: Stairs to enter (1 step) Entrance Stairs-Rails: None Entrance Stairs-Number of Steps: 1 Home Layout: One level Home Equipment: Environmental consultant - 2 wheels;Shower seat;Cane - quad;Crutches;Wheelchair - manual      Prior Function Level of Independence: Independent               Hand Dominance   Dominant Hand: Right    Extremity/Trunk Assessment   Upper Extremity Assessment: Overall WFL for tasks assessed           Lower Extremity Assessment: LLE deficits/detail   LLE Deficits / Details: 2+/5 hip strength with AAROM at hip to 90 flex and 20 abd  Cervical / Trunk Assessment: Normal  Communication   Communication: No difficulties  Cognition Arousal/Alertness: Awake/alert Behavior During Therapy: WFL for tasks assessed/performed Overall Cognitive Status: Within Functional Limits for tasks assessed                      General Comments      Exercises Total Joint Exercises Ankle Circles/Pumps: AROM;Both;15 reps;Supine Quad Sets: AROM;Both;10 reps;Supine Heel Slides: AAROM;Left;20 reps;Supine Hip ABduction/ADduction: AAROM;Left;15 reps;Supine      Assessment/Plan    PT Assessment Patient needs continued PT services  PT Diagnosis Difficulty walking   PT Problem List Decreased strength;Decreased range of motion;Decreased activity tolerance;Decreased mobility;Pain;Decreased knowledge of use of DME  PT Treatment Interventions DME instruction;Gait training;Functional mobility training;Therapeutic activities;Stair training;Therapeutic  exercise;Patient/family education   PT Goals (Current goals can be found in the Care Plan section) Acute Rehab PT Goals Patient Stated Goal: go home this day PT Goal Formulation: With patient Time For Goal Achievement: 06/23/15 Potential to Achieve Goals: Good    Frequency 7X/week   Barriers to discharge        Co-evaluation               End of Session Equipment  Utilized During Treatment: Gait belt Activity Tolerance: Patient tolerated treatment well Patient left: in chair;with call bell/phone within reach Nurse Communication: Mobility status         Time: 8115-7262 PT Time Calculation (min) (ACUTE ONLY): 30 min   Charges:   PT Evaluation $Initial PT Evaluation Tier I: 1 Procedure PT Treatments $Therapeutic Exercise: 8-22 mins   PT G Codes:        Obert Espindola 13-Jul-2015, 10:48 AM

## 2015-06-19 NOTE — Discharge Summary (Signed)
Patient ID: Lindsey Young MRN: [id number] DOB/AGE: [birth year] 64 y.o.  Admit date: 06/18/2015 Discharge date: 06/19/2015  Admission Diagnoses:  Principal Problem:   Osteoarthritis of left hip Active Problems:   Status post total replacement of left hip   Discharge Diagnoses:  Same  Past Medical History  Diagnosis Date  . H/O thyroid cyst   . Arthritis     bilateral hips  . PONV (postoperative nausea and vomiting)   . H/O cardiovascular stress test     seen by Dr. Linard Millers for SVT- (2007 approx.), then referred to Dr. Caryl Comes, , told not a candidate for ablation.   . Thrombocytopenia 10/12/2014  . Arthritis 10/12/2014  . 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 Osteopenia   . Falls frequently     pt states has had 10 broken bones in life time    Surgeries: Procedure(s): LEFT TOTAL HIP ARTHROPLASTY ANTERIOR APPROACH on 06/18/2015   Consultants:  PT  Discharged Condition: Improved  Hospital Course: Lindsey Young is an 64 y.o. female who was admitted 06/18/2015 for operative treatment ofOsteoarthritis of left hip. Patient has severe unremitting pain that affects sleep, daily activities, and work/hobbies. After pre-op clearance the patient was taken to the operating room on 06/18/2015 and underwent  Procedure(s): LEFT TOTAL HIP ARTHROPLASTY ANTERIOR APPROACH.    Patient was given perioperative antibiotics: Anti-infectives    Start     Dose/Rate Route Frequency Ordered Stop   06/18/15 1600  ceFAZolin (ANCEF) IVPB 1 g/50 mL premix     1 g 100 mL/hr over 30 Minutes Intravenous Every 6 hours 06/18/15 1423 06/18/15 2317   06/18/15 0808  ceFAZolin (ANCEF) IVPB 2 g/50 mL premix     2 g 100 mL/hr over 30 Minutes Intravenous On call to O.R. 06/18/15 0808 06/18/15 1004       Patient was given sequential compression devices, early ambulation, and chemoprophylaxis to prevent DVT.  Patient benefited maximally from hospital stay and there were no complications.     Recent vital signs: Patient Vitals for the past 24 hrs:  BP Temp Temp src Pulse Resp SpO2 Height Weight  06/19/15 0605 (!) 107/53 mmHg 98 F (36.7 C) Oral 64 14 100 % - -  06/19/15 0103 (!) 103/58 mmHg 98 F (36.7 C) Oral (!) 50 14 100 % - -  06/18/15 2104 103/65 mmHg 98.6 F (37 C) Oral (!) 53 - 100 % - -  06/18/15 1658 (!) 104/34 mmHg 97.8 F (36.6 C) Oral (!) 50 16 100 % - -  06/18/15 1551 (!) 105/46 mmHg 97.8 F (36.6 C) Oral (!) 51 15 100 % - -  06/18/15 1510 (!) 109/55 mmHg 97.5 F (36.4 C) Oral - 16 100 % - -  06/18/15 1356 (!) 108/58 mmHg 97.5 F (36.4 C) - (!) 44 14 96 % - -  06/18/15 1345 (!) 108/54 mmHg - - (!) 45 15 100 % 5\' 7"  (1.702 m) 69.854 kg (154 lb)  06/18/15 1330 115/60 mmHg - - (!) 44 12 100 % - -  06/18/15 1315 (!) 110/59 mmHg - - (!) 43 12 100 % - -  06/18/15 1300 (!) 111/55 mmHg 97.6 F (36.4 C) - (!) 43 14 100 % - -  06/18/15 1245 (!) 119/52 mmHg - - (!) 41 10 100 % - -  06/18/15 1230 (!) 111/55 mmHg - - (!) 43 14 100 % - -  06/18/15 1215 (!) 102/50 mmHg - - Marland Kitchen)  43 17 100 % - -  06/18/15 1200 (!) 102/51 mmHg 98 F (36.7 C) - (!) 49 13 100 % - -  06/18/15 1159 (!) 111/40 mmHg - - (!) 50 14 100 % - -     Recent laboratory studies:  Recent Labs  06/19/15 0553  WBC 9.3  HGB 12.5  HCT 37.2  PLT 189  NA 139  K 4.7  CL 108  CO2 24  BUN 15  CREATININE 0.61  GLUCOSE 113*  CALCIUM 8.2*     Discharge Medications:     Medication List    TAKE these medications        aspirin 325 MG EC tablet  Take 1 tablet (325 mg total) by mouth 2 (two) times daily after a meal.     cholecalciferol 1000 UNITS tablet  Commonly known as:  VITAMIN D  Take 1,000 Units by mouth daily.     docusate sodium 100 MG capsule  Commonly known as:  COLACE  Take 1 capsule (100 mg total) by mouth 2 (two) times daily.     fish oil-omega-3 fatty acids 1000 MG capsule  Take 1 g by mouth daily.     glucosamine-chondroitin 500-400 MG tablet  Take 1 tablet by mouth  daily.     methocarbamol 500 MG tablet  Commonly known as:  ROBAXIN  Take 1 tablet (500 mg total) by mouth every 6 (six) hours as needed for muscle spasms.     traMADol 50 MG tablet  Commonly known as:  ULTRAM  Take 2 tablets (100 mg total) by mouth every 6 (six) hours as needed for moderate pain.        Diagnostic Studies: Dg C-arm 61-120 Min-no Report  06/18/2015   CLINICAL DATA: surgery   C-ARM 61-120 MINUTES  Fluoroscopy was utilized by the requesting physician.  No radiographic  interpretation.    Dg Hip Port Unilat With Pelvis 1v Left  06/18/2015   CLINICAL DATA:  Status post left hip arthroplasty  EXAM: DG HIP (WITH OR WITHOUT PELVIS) 1V PORT LEFT  COMPARISON:  Intraoperative imaging earlier today.  FINDINGS: Alignment of a left hip arthroplasty appears anatomic. No fracture or abnormal lucency is seen.  IMPRESSION: Normal alignment of left hip arthroplasty.   Electronically Signed   By: Aletta Edouard M.D.   On: 06/18/2015 13:11   Dg Hip Operative Unilat With Pelvis Left  06/18/2015   CLINICAL DATA:  Status post total hip replacement  EXAM: OPERATIVE LEFT HIP 1 VIEW  TECHNIQUE: Fluoroscopic spot image(s) were submitted for interpretation post-operatively.  FLUOROSCOPY TIME:  0 MINUTES 40 SECONDS; 2 SUBMITTED IMAGES  COMPARISON:  None.  FINDINGS: There is a total hip prosthesis on the left which appears well seated on frontal view. No acute fracture or dislocation.  IMPRESSION: Prosthesis appears well-seated on single view. No acute fracture or dislocation.   Electronically Signed   By: Lowella Grip III M.D.   On: 06/18/2015 11:57    Disposition: 01-Home or Self Care      Discharge Instructions    Discharge wound care:    Complete by:  As directed   Keep dressing clean dry and intact. May shower with dressing intact.     Weight bearing as tolerated    Complete by:  As directed            Follow-up Information    Follow up with Mcarthur Rossetti, MD In 2 weeks.    Specialty:  Orthopedic Surgery  Contact information:   [place] [address] [phone]       Follow up with Mcarthur Rossetti, MD. Schedule an appointment as soon as possible for a visit in 2 weeks.   Specialty:  Orthopedic Surgery   Contact information:   [place] [address] [phone]        Signed: Erskine Emery 06/19/2015, 8:23 AM

## 2015-06-19 NOTE — Progress Notes (Signed)
Physical Therapy Treatment Patient Details Name: Lindsey Young MRN: [id number] DOB: Aug 15, 1951 Today's Date: 06/19/2015    History of Present Illness LTHA    PT Comments    Pt progressing well and eager for dc home this date.  Reviewed therex, car transfers and stairs.  Follow Up Recommendations  Home health PT     Equipment Recommendations  None recommended by PT    Recommendations for Other Services OT consult     Precautions / Restrictions Precautions Precautions: Fall Restrictions Weight Bearing Restrictions: No Other Position/Activity Restrictions: WBAT    Mobility  Bed Mobility               General bed mobility comments: pt in chair  Transfers Overall transfer level: Needs assistance Equipment used: Rolling walker (2 wheeled) Transfers: Sit to/from Stand Sit to Stand: Supervision         General transfer comment: min cues for use of UEs  Ambulation/Gait Ambulation/Gait assistance: Min guard;Supervision Ambulation Distance (Feet): 250 Feet Assistive device: Rolling walker (2 wheeled) Gait Pattern/deviations: Step-through pattern;Decreased step length - right;Decreased step length - left;Shuffle;Trunk flexed     General Gait Details: min cues for posture, position from RW and initial sequence   Stairs Stairs: Yes Stairs assistance: Min guard Stair Management: No rails;Step to pattern;Forwards;With walker Number of Stairs: 2 (single step twice)    Wheelchair Mobility    Modified Rankin (Stroke Patients Only)       Balance                                    Cognition Arousal/Alertness: Awake/alert Behavior During Therapy: WFL for tasks assessed/performed Overall Cognitive Status: Within Functional Limits for tasks assessed                      Exercises Total Joint Exercises Ankle Circles/Pumps: AROM;Both;15 reps;Supine Quad Sets: AROM;Both;10 reps;Supine Gluteal Sets: AROM;Both;10 reps;Supine Heel  Slides: AAROM;Left;20 reps;Supine Hip ABduction/ADduction: AAROM;Left;15 reps;Supine    General Comments        Pertinent Vitals/Pain Pain Assessment: 0-10 Pain Score: 3  Pain Location: L hip/thigh Pain Descriptors / Indicators: Tightness;Tender Pain Intervention(s): Limited activity within patient's tolerance;Monitored during session    [place] expects to be discharged to:: Private residence Living Arrangements: Spouse/significant other Available Help at Discharge: Family Type of Home: House Home Access: Stairs to enter (1 step) Entrance Stairs-Rails: None Home Layout: One level Home Equipment: Environmental consultant - 2 wheels;Shower seat;Cane - quad;Crutches;Wheelchair - manual      Prior Function Level of Independence: Independent          PT Goals (current goals can now be found in the care plan section) Acute Rehab PT Goals Patient Stated Goal: go home this day PT Goal Formulation: With patient Time For Goal Achievement: 06/23/15 Potential to Achieve Goals: Good Progress towards PT goals: Progressing toward goals    Frequency  7X/week    PT Plan Current plan remains appropriate    Co-evaluation             End of Session Equipment Utilized During Treatment: Gait belt Activity Tolerance: Patient tolerated treatment well Patient left: in chair;with call bell/phone within reach     Time: 1316-1345 PT Time Calculation (min) (ACUTE ONLY): 29 min  Charges:  $Gait Training: 8-22 mins $Therapeutic Exercise: 8-22 mins  G Codes:      Debe Anfinson 2015-06-24, 1:59 PM

## 2015-11-04 ENCOUNTER — Other Ambulatory Visit: Payer: Self-pay

## 2015-11-04 DIAGNOSIS — Z1231 Encounter for screening mammogram for malignant neoplasm of breast: Secondary | ICD-10-CM

## 2015-12-08 ENCOUNTER — Ambulatory Visit
Admission: RE | Admit: 2015-12-08 | Discharge: 2015-12-08 | Disposition: A | Discharge status: Home / Self Care | Payer: 59 | Source: Ambulatory Visit

## 2015-12-08 DIAGNOSIS — Z1231 Encounter for screening mammogram for malignant neoplasm of breast: Secondary | ICD-10-CM

## 2016-05-30 DIAGNOSIS — M1612 Unilateral primary osteoarthritis, left hip: Secondary | ICD-10-CM | POA: Diagnosis not present

## 2016-08-31 DIAGNOSIS — Z0000 Encounter for general adult medical examination without abnormal findings: Secondary | ICD-10-CM | POA: Diagnosis not present

## 2016-08-31 DIAGNOSIS — Z131 Encounter for screening for diabetes mellitus: Secondary | ICD-10-CM | POA: Diagnosis not present

## 2016-08-31 DIAGNOSIS — Z1389 Encounter for screening for other disorder: Secondary | ICD-10-CM | POA: Diagnosis not present

## 2016-08-31 DIAGNOSIS — M85852 Other specified disorders of bone density and structure, left thigh: Secondary | ICD-10-CM | POA: Diagnosis not present

## 2016-08-31 DIAGNOSIS — Z23 Encounter for immunization: Secondary | ICD-10-CM | POA: Diagnosis not present

## 2016-08-31 DIAGNOSIS — Z01419 Encounter for gynecological examination (general) (routine) without abnormal findings: Secondary | ICD-10-CM | POA: Diagnosis not present

## 2016-08-31 DIAGNOSIS — Z136 Encounter for screening for cardiovascular disorders: Secondary | ICD-10-CM | POA: Diagnosis not present

## 2016-09-01 ENCOUNTER — Other Ambulatory Visit: Payer: Self-pay | Admitting: Family Medicine

## 2016-09-01 DIAGNOSIS — M8580 Other specified disorders of bone density and structure, unspecified site: Secondary | ICD-10-CM

## 2016-10-23 ENCOUNTER — Ambulatory Visit
Admission: RE | Admit: 2016-10-23 | Discharge: 2016-10-23 | Disposition: A | Discharge status: Home / Self Care | Payer: PPO | Source: Ambulatory Visit | Attending: Family Medicine | Admitting: Family Medicine

## 2016-10-23 DIAGNOSIS — M8589 Other specified disorders of bone density and structure, multiple sites: Secondary | ICD-10-CM | POA: Diagnosis not present

## 2016-10-23 DIAGNOSIS — M8580 Other specified disorders of bone density and structure, unspecified site: Secondary | ICD-10-CM

## 2016-12-12 DIAGNOSIS — M5032 Other cervical disc degeneration, mid-cervical region, unspecified level: Secondary | ICD-10-CM | POA: Diagnosis not present

## 2016-12-12 DIAGNOSIS — M9901 Segmental and somatic dysfunction of cervical region: Secondary | ICD-10-CM | POA: Diagnosis not present

## 2016-12-12 DIAGNOSIS — M9902 Segmental and somatic dysfunction of thoracic region: Secondary | ICD-10-CM | POA: Diagnosis not present

## 2016-12-12 DIAGNOSIS — M791 Myalgia: Secondary | ICD-10-CM | POA: Diagnosis not present

## 2016-12-18 DIAGNOSIS — M9901 Segmental and somatic dysfunction of cervical region: Secondary | ICD-10-CM | POA: Diagnosis not present

## 2016-12-18 DIAGNOSIS — M9902 Segmental and somatic dysfunction of thoracic region: Secondary | ICD-10-CM | POA: Diagnosis not present

## 2016-12-18 DIAGNOSIS — M791 Myalgia: Secondary | ICD-10-CM | POA: Diagnosis not present

## 2016-12-18 DIAGNOSIS — M5032 Other cervical disc degeneration, mid-cervical region, unspecified level: Secondary | ICD-10-CM | POA: Diagnosis not present

## 2016-12-20 DIAGNOSIS — M791 Myalgia: Secondary | ICD-10-CM | POA: Diagnosis not present

## 2016-12-20 DIAGNOSIS — M5032 Other cervical disc degeneration, mid-cervical region, unspecified level: Secondary | ICD-10-CM | POA: Diagnosis not present

## 2016-12-20 DIAGNOSIS — M9902 Segmental and somatic dysfunction of thoracic region: Secondary | ICD-10-CM | POA: Diagnosis not present

## 2016-12-20 DIAGNOSIS — M9901 Segmental and somatic dysfunction of cervical region: Secondary | ICD-10-CM | POA: Diagnosis not present

## 2016-12-21 DIAGNOSIS — M791 Myalgia: Secondary | ICD-10-CM | POA: Diagnosis not present

## 2016-12-21 DIAGNOSIS — M5032 Other cervical disc degeneration, mid-cervical region, unspecified level: Secondary | ICD-10-CM | POA: Diagnosis not present

## 2016-12-21 DIAGNOSIS — M9901 Segmental and somatic dysfunction of cervical region: Secondary | ICD-10-CM | POA: Diagnosis not present

## 2016-12-21 DIAGNOSIS — M9902 Segmental and somatic dysfunction of thoracic region: Secondary | ICD-10-CM | POA: Diagnosis not present

## 2016-12-25 DIAGNOSIS — M9902 Segmental and somatic dysfunction of thoracic region: Secondary | ICD-10-CM | POA: Diagnosis not present

## 2016-12-25 DIAGNOSIS — M5032 Other cervical disc degeneration, mid-cervical region, unspecified level: Secondary | ICD-10-CM | POA: Diagnosis not present

## 2016-12-25 DIAGNOSIS — M791 Myalgia: Secondary | ICD-10-CM | POA: Diagnosis not present

## 2016-12-25 DIAGNOSIS — M9901 Segmental and somatic dysfunction of cervical region: Secondary | ICD-10-CM | POA: Diagnosis not present

## 2016-12-27 DIAGNOSIS — M791 Myalgia: Secondary | ICD-10-CM | POA: Diagnosis not present

## 2016-12-27 DIAGNOSIS — M5032 Other cervical disc degeneration, mid-cervical region, unspecified level: Secondary | ICD-10-CM | POA: Diagnosis not present

## 2016-12-27 DIAGNOSIS — M9902 Segmental and somatic dysfunction of thoracic region: Secondary | ICD-10-CM | POA: Diagnosis not present

## 2016-12-27 DIAGNOSIS — M9901 Segmental and somatic dysfunction of cervical region: Secondary | ICD-10-CM | POA: Diagnosis not present

## 2016-12-28 DIAGNOSIS — M791 Myalgia: Secondary | ICD-10-CM | POA: Diagnosis not present

## 2016-12-28 DIAGNOSIS — M5032 Other cervical disc degeneration, mid-cervical region, unspecified level: Secondary | ICD-10-CM | POA: Diagnosis not present

## 2016-12-28 DIAGNOSIS — M9902 Segmental and somatic dysfunction of thoracic region: Secondary | ICD-10-CM | POA: Diagnosis not present

## 2016-12-28 DIAGNOSIS — M9901 Segmental and somatic dysfunction of cervical region: Secondary | ICD-10-CM | POA: Diagnosis not present

## 2017-01-01 DIAGNOSIS — M9901 Segmental and somatic dysfunction of cervical region: Secondary | ICD-10-CM | POA: Diagnosis not present

## 2017-01-01 DIAGNOSIS — M5032 Other cervical disc degeneration, mid-cervical region, unspecified level: Secondary | ICD-10-CM | POA: Diagnosis not present

## 2017-01-01 DIAGNOSIS — M9902 Segmental and somatic dysfunction of thoracic region: Secondary | ICD-10-CM | POA: Diagnosis not present

## 2017-01-01 DIAGNOSIS — M791 Myalgia: Secondary | ICD-10-CM | POA: Diagnosis not present

## 2017-01-03 DIAGNOSIS — M9902 Segmental and somatic dysfunction of thoracic region: Secondary | ICD-10-CM | POA: Diagnosis not present

## 2017-01-03 DIAGNOSIS — M5032 Other cervical disc degeneration, mid-cervical region, unspecified level: Secondary | ICD-10-CM | POA: Diagnosis not present

## 2017-01-03 DIAGNOSIS — M9901 Segmental and somatic dysfunction of cervical region: Secondary | ICD-10-CM | POA: Diagnosis not present

## 2017-01-03 DIAGNOSIS — M791 Myalgia: Secondary | ICD-10-CM | POA: Diagnosis not present

## 2017-01-04 DIAGNOSIS — M791 Myalgia: Secondary | ICD-10-CM | POA: Diagnosis not present

## 2017-01-04 DIAGNOSIS — M9901 Segmental and somatic dysfunction of cervical region: Secondary | ICD-10-CM | POA: Diagnosis not present

## 2017-01-04 DIAGNOSIS — M5032 Other cervical disc degeneration, mid-cervical region, unspecified level: Secondary | ICD-10-CM | POA: Diagnosis not present

## 2017-01-04 DIAGNOSIS — M9902 Segmental and somatic dysfunction of thoracic region: Secondary | ICD-10-CM | POA: Diagnosis not present

## 2017-01-08 DIAGNOSIS — M9901 Segmental and somatic dysfunction of cervical region: Secondary | ICD-10-CM | POA: Diagnosis not present

## 2017-01-08 DIAGNOSIS — M5032 Other cervical disc degeneration, mid-cervical region, unspecified level: Secondary | ICD-10-CM | POA: Diagnosis not present

## 2017-01-08 DIAGNOSIS — M9902 Segmental and somatic dysfunction of thoracic region: Secondary | ICD-10-CM | POA: Diagnosis not present

## 2017-01-08 DIAGNOSIS — M791 Myalgia: Secondary | ICD-10-CM | POA: Diagnosis not present

## 2017-01-10 DIAGNOSIS — M9902 Segmental and somatic dysfunction of thoracic region: Secondary | ICD-10-CM | POA: Diagnosis not present

## 2017-01-10 DIAGNOSIS — M9901 Segmental and somatic dysfunction of cervical region: Secondary | ICD-10-CM | POA: Diagnosis not present

## 2017-01-10 DIAGNOSIS — M791 Myalgia: Secondary | ICD-10-CM | POA: Diagnosis not present

## 2017-01-10 DIAGNOSIS — M5032 Other cervical disc degeneration, mid-cervical region, unspecified level: Secondary | ICD-10-CM | POA: Diagnosis not present

## 2017-01-11 DIAGNOSIS — M5032 Other cervical disc degeneration, mid-cervical region, unspecified level: Secondary | ICD-10-CM | POA: Diagnosis not present

## 2017-01-11 DIAGNOSIS — M9902 Segmental and somatic dysfunction of thoracic region: Secondary | ICD-10-CM | POA: Diagnosis not present

## 2017-01-11 DIAGNOSIS — M9901 Segmental and somatic dysfunction of cervical region: Secondary | ICD-10-CM | POA: Diagnosis not present

## 2017-01-11 DIAGNOSIS — M791 Myalgia: Secondary | ICD-10-CM | POA: Diagnosis not present

## 2017-01-16 DIAGNOSIS — M9902 Segmental and somatic dysfunction of thoracic region: Secondary | ICD-10-CM | POA: Diagnosis not present

## 2017-01-16 DIAGNOSIS — M5032 Other cervical disc degeneration, mid-cervical region, unspecified level: Secondary | ICD-10-CM | POA: Diagnosis not present

## 2017-01-16 DIAGNOSIS — M9901 Segmental and somatic dysfunction of cervical region: Secondary | ICD-10-CM | POA: Diagnosis not present

## 2017-01-16 DIAGNOSIS — M791 Myalgia: Secondary | ICD-10-CM | POA: Diagnosis not present

## 2017-01-18 DIAGNOSIS — M5032 Other cervical disc degeneration, mid-cervical region, unspecified level: Secondary | ICD-10-CM | POA: Diagnosis not present

## 2017-01-18 DIAGNOSIS — M9901 Segmental and somatic dysfunction of cervical region: Secondary | ICD-10-CM | POA: Diagnosis not present

## 2017-01-18 DIAGNOSIS — M791 Myalgia: Secondary | ICD-10-CM | POA: Diagnosis not present

## 2017-01-18 DIAGNOSIS — M9902 Segmental and somatic dysfunction of thoracic region: Secondary | ICD-10-CM | POA: Diagnosis not present

## 2017-01-22 DIAGNOSIS — M791 Myalgia: Secondary | ICD-10-CM | POA: Diagnosis not present

## 2017-01-22 DIAGNOSIS — M5032 Other cervical disc degeneration, mid-cervical region, unspecified level: Secondary | ICD-10-CM | POA: Diagnosis not present

## 2017-01-22 DIAGNOSIS — M9902 Segmental and somatic dysfunction of thoracic region: Secondary | ICD-10-CM | POA: Diagnosis not present

## 2017-01-22 DIAGNOSIS — M9901 Segmental and somatic dysfunction of cervical region: Secondary | ICD-10-CM | POA: Diagnosis not present

## 2017-01-25 DIAGNOSIS — M9902 Segmental and somatic dysfunction of thoracic region: Secondary | ICD-10-CM | POA: Diagnosis not present

## 2017-01-25 DIAGNOSIS — M9901 Segmental and somatic dysfunction of cervical region: Secondary | ICD-10-CM | POA: Diagnosis not present

## 2017-01-25 DIAGNOSIS — M791 Myalgia: Secondary | ICD-10-CM | POA: Diagnosis not present

## 2017-01-25 DIAGNOSIS — M5032 Other cervical disc degeneration, mid-cervical region, unspecified level: Secondary | ICD-10-CM | POA: Diagnosis not present

## 2017-01-29 DIAGNOSIS — M791 Myalgia: Secondary | ICD-10-CM | POA: Diagnosis not present

## 2017-01-29 DIAGNOSIS — M5032 Other cervical disc degeneration, mid-cervical region, unspecified level: Secondary | ICD-10-CM | POA: Diagnosis not present

## 2017-01-29 DIAGNOSIS — M9902 Segmental and somatic dysfunction of thoracic region: Secondary | ICD-10-CM | POA: Diagnosis not present

## 2017-01-29 DIAGNOSIS — M9901 Segmental and somatic dysfunction of cervical region: Secondary | ICD-10-CM | POA: Diagnosis not present

## 2017-02-01 DIAGNOSIS — M9901 Segmental and somatic dysfunction of cervical region: Secondary | ICD-10-CM | POA: Diagnosis not present

## 2017-02-01 DIAGNOSIS — M9902 Segmental and somatic dysfunction of thoracic region: Secondary | ICD-10-CM | POA: Diagnosis not present

## 2017-02-01 DIAGNOSIS — M5032 Other cervical disc degeneration, mid-cervical region, unspecified level: Secondary | ICD-10-CM | POA: Diagnosis not present

## 2017-02-01 DIAGNOSIS — M791 Myalgia: Secondary | ICD-10-CM | POA: Diagnosis not present

## 2017-02-05 DIAGNOSIS — M5032 Other cervical disc degeneration, mid-cervical region, unspecified level: Secondary | ICD-10-CM | POA: Diagnosis not present

## 2017-02-05 DIAGNOSIS — M791 Myalgia: Secondary | ICD-10-CM | POA: Diagnosis not present

## 2017-02-05 DIAGNOSIS — M9902 Segmental and somatic dysfunction of thoracic region: Secondary | ICD-10-CM | POA: Diagnosis not present

## 2017-02-05 DIAGNOSIS — M9901 Segmental and somatic dysfunction of cervical region: Secondary | ICD-10-CM | POA: Diagnosis not present

## 2017-02-08 DIAGNOSIS — M9901 Segmental and somatic dysfunction of cervical region: Secondary | ICD-10-CM | POA: Diagnosis not present

## 2017-02-08 DIAGNOSIS — M5032 Other cervical disc degeneration, mid-cervical region, unspecified level: Secondary | ICD-10-CM | POA: Diagnosis not present

## 2017-02-08 DIAGNOSIS — M791 Myalgia: Secondary | ICD-10-CM | POA: Diagnosis not present

## 2017-02-08 DIAGNOSIS — M9902 Segmental and somatic dysfunction of thoracic region: Secondary | ICD-10-CM | POA: Diagnosis not present

## 2017-02-12 DIAGNOSIS — M791 Myalgia: Secondary | ICD-10-CM | POA: Diagnosis not present

## 2017-02-12 DIAGNOSIS — M9902 Segmental and somatic dysfunction of thoracic region: Secondary | ICD-10-CM | POA: Diagnosis not present

## 2017-02-12 DIAGNOSIS — M9901 Segmental and somatic dysfunction of cervical region: Secondary | ICD-10-CM | POA: Diagnosis not present

## 2017-02-12 DIAGNOSIS — M5032 Other cervical disc degeneration, mid-cervical region, unspecified level: Secondary | ICD-10-CM | POA: Diagnosis not present

## 2017-02-14 DIAGNOSIS — H938X1 Other specified disorders of right ear: Secondary | ICD-10-CM | POA: Diagnosis not present

## 2017-02-19 DIAGNOSIS — M5032 Other cervical disc degeneration, mid-cervical region, unspecified level: Secondary | ICD-10-CM | POA: Diagnosis not present

## 2017-02-19 DIAGNOSIS — M9902 Segmental and somatic dysfunction of thoracic region: Secondary | ICD-10-CM | POA: Diagnosis not present

## 2017-02-19 DIAGNOSIS — M9901 Segmental and somatic dysfunction of cervical region: Secondary | ICD-10-CM | POA: Diagnosis not present

## 2017-02-19 DIAGNOSIS — M791 Myalgia: Secondary | ICD-10-CM | POA: Diagnosis not present

## 2017-02-26 DIAGNOSIS — M9901 Segmental and somatic dysfunction of cervical region: Secondary | ICD-10-CM | POA: Diagnosis not present

## 2017-02-26 DIAGNOSIS — M9902 Segmental and somatic dysfunction of thoracic region: Secondary | ICD-10-CM | POA: Diagnosis not present

## 2017-02-26 DIAGNOSIS — M791 Myalgia: Secondary | ICD-10-CM | POA: Diagnosis not present

## 2017-02-26 DIAGNOSIS — M5032 Other cervical disc degeneration, mid-cervical region, unspecified level: Secondary | ICD-10-CM | POA: Diagnosis not present

## 2017-03-05 DIAGNOSIS — M9901 Segmental and somatic dysfunction of cervical region: Secondary | ICD-10-CM | POA: Diagnosis not present

## 2017-03-05 DIAGNOSIS — M9902 Segmental and somatic dysfunction of thoracic region: Secondary | ICD-10-CM | POA: Diagnosis not present

## 2017-03-05 DIAGNOSIS — M791 Myalgia: Secondary | ICD-10-CM | POA: Diagnosis not present

## 2017-03-05 DIAGNOSIS — M5032 Other cervical disc degeneration, mid-cervical region, unspecified level: Secondary | ICD-10-CM | POA: Diagnosis not present

## 2017-04-10 ENCOUNTER — Ambulatory Visit (INDEPENDENT_AMBULATORY_CARE_PROVIDER_SITE_OTHER): Payer: PPO

## 2017-04-10 ENCOUNTER — Ambulatory Visit (INDEPENDENT_AMBULATORY_CARE_PROVIDER_SITE_OTHER): Payer: PPO | Admitting: Orthopaedic Surgery

## 2017-04-10 DIAGNOSIS — G8929 Other chronic pain: Secondary | ICD-10-CM

## 2017-04-10 DIAGNOSIS — M25562 Pain in left knee: Secondary | ICD-10-CM

## 2017-04-10 DIAGNOSIS — Z969 Presence of functional implant, unspecified: Secondary | ICD-10-CM | POA: Diagnosis not present

## 2017-04-10 NOTE — Progress Notes (Signed)
Office Visit Note   Patient: Lindsey Young           Date of Birth: 1951-10-11           MRN: [id number] Visit Date: 04/10/2017              Requested by: Cari Caraway, [place], [place] [address] PCP: Cari Caraway, MD   Assessment & Plan: Visit Diagnoses:  1. Chronic pain of left knee   2. Retained orthopedic hardware     Plan: I do feel that having the hardware removed from her left knee at this standpoint would help her symptomatically. She has a tension band wire and screws. We would need just a hardware removal set for getting the screws out. I would actually perform an arthroscopic intervention of the knee as well with a knee scope to get underneath the patella to assess the cartilage and remove any type of bone spurs. We try to keep the other incisions to minimal to take out the hardware. We long thorough discussion about this and went over x-rays. I gave her her surgery scheduler's card to this can be done as an outpatient over the surgical [place]. All questions were encouraged and answered. If we decide to have the surgery would then see her back in 2 weeks postoperative for suture removal.  Follow-Up Instructions: Return if symptoms worsen or fail to improve.   Orders:  Orders Placed This Encounter  Procedures  . XR Knee 1-2 Views Left   No orders of the defined types were placed in this encounter.     Procedures: No procedures performed   Clinical Data: No additional findings.   Subjective: No chief complaint on file. The patient is well-known to me because of performed hip replacement on her. Her left knee is what she see me for today. His left knee has been painful and symptomatic from her after she had open reduction internal fixation of a patella fracture 4 years ago by another Psychologist, sport and exercise in town. The knee has hurt since then and still gives her problems. She points to areas on the knee where she is symptomatic from  prominent hardware. Is more of an irritating pain that affects her activity is daily living her mobility. Effexor sports as well. She is very active 66 year old.  HPI  Review of Systems She denies any chest pain, headache, short of breath, fever, chills, nausea, vomiting.  Objective: Vital Signs: There were no vitals taken for this visit.  Physical Exam She is alert and oriented 3 in no acute distress Ortho Exam Examination of her left knee shows a patella tracks well. There is some patellofemoral crepitation. Her incision is well-healed on the midline. There is prominent areas of the tension band on the lateral patella as well as the superior aspect and this versus most painful. The knee otherwise feels stable on exam. Deathly the hardware is symptomatic though. Specialty Comments:  No specialty comments available.  Imaging: Xr Knee 1-2 Views Left  Result Date: 04/10/2017 2 views of her left knee show retained orthopedic hardware from a tension band wire and screws traversing a patella fracture that is healed. There is mild to moderate arthritis at the patellofemoral joint.    PMFS History: Patient Active Problem List   Diagnosis Date Noted  . Retained orthopedic hardware 04/10/2017  . Chronic pain of left knee 04/10/2017  . Osteoarthritis of left hip 06/18/2015  . Status post total replacement of left  hip 06/18/2015  . Thrombocytopenia ([place]) 10/12/2014  . Arthritis 10/12/2014  . OSTEOARTHRITIS, HIPS, BILATERAL 08/25/2008  . FRACTURE, ANKLE, RIGHT 08/25/2008   Past Medical History:  Diagnosis Date  . Arthritis    bilateral hips  . Arthritis 10/12/2014  . Dysrhythmia    SVT (all hormone induced 7-8 years ago)  . Falls frequently    pt states has had 10 broken bones in life time  . H/O cardiovascular stress test    seen by Dr. Linard Millers for SVT- (2007 approx.), then referred to Dr. Caryl Comes, , told not a candidate for ablation.   . H/O thyroid cyst   . Osteopenia   . PONV  (postoperative nausea and vomiting)   . Thrombocytopenia 10/12/2014    Family History  Problem Relation Age of Onset  . Cancer Father        liver cancer, DVT  . Cancer Brother        DVT and PE    Past Surgical History:  Procedure Laterality Date  . APPENDECTOMY    . COLONOSCOPY    . DILATION AND CURETTAGE OF UTERUS     secondary to polyps  . EYE SURGERY     bilat cataract surgery   . foot pinned     left   . FRACTURE SURGERY     trimalleallor fracture, foot & ankle fixation   . ORIF PATELLA Left 08/21/2013   Procedure: OPEN REDUCTION INTERNAL (ORIF) OF A LEFT PATELLA FRACTURE/POSSIBLE LEFT PARTIAL PATELLALECTOMY;  Surgeon: Marin Shutter, MD;  Location: [facility];  Service: Orthopedics;  Laterality: Left;  . TOTAL HIP ARTHROPLASTY Left 06/18/2015   Procedure: LEFT TOTAL HIP ARTHROPLASTY ANTERIOR APPROACH;  Surgeon: Mcarthur Rossetti, MD;  Location: [facility];  Service: Orthopedics;  Laterality: Left;   Social History   Occupational History  . Not on file.   Social History Main Topics  . Smoking status: Former Smoker    Packs/day: 0.25    Years: 20.00    Types: Cigarettes    Quit date: 11/20/1993  . Smokeless tobacco: Never Used  . Alcohol use 4.2 oz/week    7 Glasses of wine per week     Comment: 7 glasses / week  . Drug use: No  . Sexual activity: Not on file

## 2017-05-17 ENCOUNTER — Encounter: Payer: Self-pay | Admitting: Physician Assistant

## 2017-05-17 DIAGNOSIS — T8484XA Pain due to internal orthopedic prosthetic devices, implants and grafts, initial encounter: Secondary | ICD-10-CM | POA: Diagnosis not present

## 2017-05-17 DIAGNOSIS — M94262 Chondromalacia, left knee: Secondary | ICD-10-CM | POA: Diagnosis not present

## 2017-05-17 DIAGNOSIS — T8484XD Pain due to internal orthopedic prosthetic devices, implants and grafts, subsequent encounter: Secondary | ICD-10-CM | POA: Diagnosis not present

## 2017-05-17 DIAGNOSIS — M2242 Chondromalacia patellae, left knee: Secondary | ICD-10-CM | POA: Diagnosis not present

## 2017-05-30 ENCOUNTER — Ambulatory Visit (INDEPENDENT_AMBULATORY_CARE_PROVIDER_SITE_OTHER): Payer: PPO | Admitting: Physician Assistant

## 2017-05-30 DIAGNOSIS — Z9889 Other specified postprocedural states: Secondary | ICD-10-CM

## 2017-05-30 NOTE — Progress Notes (Signed)
Office Visit Note   Patient: Lindsey Young           Date of Birth: 08/27/1951           MRN: [id number] Visit Date: 05/30/2017              Requested by: Cari Caraway, [place], [place] [address] PCP: Cari Caraway, MD   Assessment & Plan: Visit Diagnoses:  1. S/P left knee arthroscopy     Plan:She'll work on Forensic scientist. Did discuss with her anti-inflammatories and she feels glucosamine/chondroitin ordersfor continue this. Follow with [place] on an as-needed basis. She may benefit from a supplemental injection in the knee in the future.  Follow-Up Instructions: Return if symptoms worsen or fail to improve.   Orders:  No orders of the defined types were placed in this encounter.  No orders of the defined types were placed in this encounter.     Procedures: No procedures performed   Clinical Data: No additional findings.   Subjective: Status post left knee patella hardware removal and knee arthroscopy  HPI Mrs. Siragusa returns today status post left knee patella hardware removal and knee arthroscopy 05/17/2017. She states overall the knee feels great. She is having some stiffness but is otherwise are slowly returning to her normal activities. She's having no cal pain. Knee arthroscopy should the medial lateral compartments to be well preserved. 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  Physical Exam  Constitutional: She is oriented to person, place, and time. She appears well-developed and well-nourished. No distress.  Neurological: She is alert and oriented to person, place, and time.  Skin: She is not diaphoretic.  Psychiatric: She has a normal mood and affect. Her behavior is normal.    Ortho Exam Left knee she has full range of motion. Some edema no effusion.  Surgical incision is well-healed. Calf supple nontender Specialty Comments:  No specialty comments available.  Imaging: No results found.   PMFS History: Patient Active Problem List   Diagnosis Date Noted  . S/P left knee arthroscopy 05/30/2017  . Retained orthopedic hardware 04/10/2017  . Chronic pain of left knee 04/10/2017  . Osteoarthritis of left hip 06/18/2015  . Status post total replacement of left hip 06/18/2015  . Thrombocytopenia ([place]) 10/12/2014  . Arthritis 10/12/2014  . OSTEOARTHRITIS, HIPS, BILATERAL 08/25/2008  . FRACTURE, ANKLE, RIGHT 08/25/2008   Past Medical History:  Diagnosis Date  . Arthritis    bilateral hips  . Arthritis 10/12/2014  . Dysrhythmia    SVT (all hormone induced 7-8 years ago)  . Falls frequently    pt states has had 10 broken bones in life time  . H/O cardiovascular stress test    seen by Dr. Linard Millers for SVT- (2007 approx.), then referred to Dr. Caryl Comes, , told not a candidate for ablation.   . H/O thyroid cyst   . Osteopenia   . PONV (postoperative nausea and vomiting)   . Thrombocytopenia 10/12/2014    Family History  Problem Relation Age of Onset  . Cancer Father        liver cancer, DVT  . Cancer Brother        DVT and PE    Past Surgical History:  Procedure Laterality Date  . APPENDECTOMY    . COLONOSCOPY    .  DILATION AND CURETTAGE OF UTERUS     secondary to polyps  . EYE SURGERY     bilat cataract surgery   . foot pinned     left   . FRACTURE SURGERY     trimalleallor fracture, foot & ankle fixation   . ORIF PATELLA Left 08/21/2013   Procedure: OPEN REDUCTION INTERNAL (ORIF) OF A LEFT PATELLA FRACTURE/POSSIBLE LEFT PARTIAL PATELLALECTOMY;  Surgeon: Marin Shutter, MD;  Location: [facility];  Service: Orthopedics;  Laterality: Left;  . TOTAL HIP ARTHROPLASTY Left 06/18/2015   Procedure: LEFT TOTAL HIP ARTHROPLASTY ANTERIOR APPROACH;  Surgeon: Mcarthur Rossetti, MD;  Location[facility];  Service: Orthopedics;  Laterality:  Left;   Social History   Occupational History  . Not on file.   Social History Main Topics  . Smoking status: Former Smoker    Packs/day: 0.25    Years: 20.00    Types: Cigarettes    Quit date: 11/20/1993  . Smokeless tobacco: Never Used  . Alcohol use 4.2 oz/week    7 Glasses of wine per week     Comment: 7 glasses / week  . Drug use: No  . Sexual activity: Not on file

## 2017-09-25 DIAGNOSIS — Z23 Encounter for immunization: Secondary | ICD-10-CM | POA: Diagnosis not present

## 2017-10-09 DIAGNOSIS — H18413 Arcus senilis, bilateral: Secondary | ICD-10-CM | POA: Diagnosis not present

## 2017-10-09 DIAGNOSIS — Z961 Presence of intraocular lens: Secondary | ICD-10-CM | POA: Diagnosis not present

## 2017-10-09 DIAGNOSIS — H02839 Dermatochalasis of unspecified eye, unspecified eyelid: Secondary | ICD-10-CM | POA: Diagnosis not present

## 2017-10-09 DIAGNOSIS — H43811 Vitreous degeneration, right eye: Secondary | ICD-10-CM | POA: Diagnosis not present

## 2017-10-25 ENCOUNTER — Other Ambulatory Visit: Payer: Self-pay | Admitting: Family Medicine

## 2017-10-25 DIAGNOSIS — Z139 Encounter for screening, unspecified: Secondary | ICD-10-CM

## 2017-11-27 ENCOUNTER — Ambulatory Visit
Admission: RE | Admit: 2017-11-27 | Discharge: 2017-11-27 | Disposition: A | Discharge status: Home / Self Care | Payer: PPO | Source: Ambulatory Visit | Attending: Family Medicine | Admitting: Family Medicine

## 2017-11-27 DIAGNOSIS — Z1231 Encounter for screening mammogram for malignant neoplasm of breast: Secondary | ICD-10-CM | POA: Diagnosis not present

## 2017-11-27 DIAGNOSIS — Z139 Encounter for screening, unspecified: Secondary | ICD-10-CM

## 2018-02-11 ENCOUNTER — Other Ambulatory Visit: Payer: Self-pay | Admitting: Family Medicine

## 2018-02-11 DIAGNOSIS — Z0001 Encounter for general adult medical examination with abnormal findings: Secondary | ICD-10-CM | POA: Diagnosis not present

## 2018-02-11 DIAGNOSIS — H938X1 Other specified disorders of right ear: Secondary | ICD-10-CM | POA: Diagnosis not present

## 2018-02-11 DIAGNOSIS — Z1389 Encounter for screening for other disorder: Secondary | ICD-10-CM | POA: Diagnosis not present

## 2018-02-11 DIAGNOSIS — R350 Frequency of micturition: Secondary | ICD-10-CM | POA: Diagnosis not present

## 2018-02-11 DIAGNOSIS — M85852 Other specified disorders of bone density and structure, left thigh: Secondary | ICD-10-CM | POA: Diagnosis not present

## 2018-02-11 DIAGNOSIS — E559 Vitamin D deficiency, unspecified: Secondary | ICD-10-CM | POA: Diagnosis not present

## 2018-02-11 DIAGNOSIS — Z131 Encounter for screening for diabetes mellitus: Secondary | ICD-10-CM | POA: Diagnosis not present

## 2018-02-11 DIAGNOSIS — E041 Nontoxic single thyroid nodule: Secondary | ICD-10-CM

## 2018-02-11 DIAGNOSIS — G479 Sleep disorder, unspecified: Secondary | ICD-10-CM | POA: Diagnosis not present

## 2018-02-11 DIAGNOSIS — Z0000 Encounter for general adult medical examination without abnormal findings: Secondary | ICD-10-CM | POA: Diagnosis not present

## 2018-02-11 DIAGNOSIS — Z1211 Encounter for screening for malignant neoplasm of colon: Secondary | ICD-10-CM | POA: Diagnosis not present

## 2018-02-11 DIAGNOSIS — Z23 Encounter for immunization: Secondary | ICD-10-CM | POA: Diagnosis not present

## 2018-02-14 ENCOUNTER — Ambulatory Visit
Admission: RE | Admit: 2018-02-14 | Discharge: 2018-02-14 | Disposition: A | Discharge status: Home / Self Care | Payer: PPO | Source: Ambulatory Visit | Attending: Family Medicine | Admitting: Family Medicine

## 2018-02-14 DIAGNOSIS — E041 Nontoxic single thyroid nodule: Secondary | ICD-10-CM

## 2018-02-14 DIAGNOSIS — E042 Nontoxic multinodular goiter: Secondary | ICD-10-CM | POA: Diagnosis not present

## 2018-03-06 DIAGNOSIS — Z1211 Encounter for screening for malignant neoplasm of colon: Secondary | ICD-10-CM | POA: Diagnosis not present

## 2018-03-06 DIAGNOSIS — K5730 Diverticulosis of large intestine without perforation or abscess without bleeding: Secondary | ICD-10-CM | POA: Diagnosis not present

## 2018-03-06 DIAGNOSIS — K635 Polyp of colon: Secondary | ICD-10-CM | POA: Diagnosis not present

## 2018-03-12 DIAGNOSIS — K635 Polyp of colon: Secondary | ICD-10-CM | POA: Diagnosis not present

## 2018-03-12 DIAGNOSIS — Z1211 Encounter for screening for malignant neoplasm of colon: Secondary | ICD-10-CM | POA: Diagnosis not present

## 2018-05-14 DIAGNOSIS — L821 Other seborrheic keratosis: Secondary | ICD-10-CM | POA: Diagnosis not present

## 2018-05-14 DIAGNOSIS — B070 Plantar wart: Secondary | ICD-10-CM | POA: Diagnosis not present

## 2018-06-06 DIAGNOSIS — E041 Nontoxic single thyroid nodule: Secondary | ICD-10-CM | POA: Diagnosis not present

## 2018-06-06 DIAGNOSIS — F439 Reaction to severe stress, unspecified: Secondary | ICD-10-CM | POA: Diagnosis not present

## 2018-09-04 DIAGNOSIS — Z23 Encounter for immunization: Secondary | ICD-10-CM | POA: Diagnosis not present

## 2018-12-06 DIAGNOSIS — B9689 Other specified bacterial agents as the cause of diseases classified elsewhere: Secondary | ICD-10-CM | POA: Diagnosis not present

## 2018-12-06 DIAGNOSIS — J019 Acute sinusitis, unspecified: Secondary | ICD-10-CM | POA: Diagnosis not present

## 2019-02-13 ENCOUNTER — Other Ambulatory Visit: Payer: Self-pay | Admitting: Family Medicine

## 2019-02-13 ENCOUNTER — Other Ambulatory Visit (HOSPITAL_COMMUNITY)
Admission: RE | Admit: 2019-02-13 | Discharge: 2019-02-13 | Disposition: A | Discharge status: Home / Self Care | Payer: PPO | Source: Ambulatory Visit | Attending: Family Medicine | Admitting: Family Medicine

## 2019-02-13 DIAGNOSIS — Z124 Encounter for screening for malignant neoplasm of cervix: Secondary | ICD-10-CM | POA: Diagnosis not present

## 2019-02-13 DIAGNOSIS — Z1389 Encounter for screening for other disorder: Secondary | ICD-10-CM | POA: Diagnosis not present

## 2019-02-13 DIAGNOSIS — Z0000 Encounter for general adult medical examination without abnormal findings: Secondary | ICD-10-CM | POA: Diagnosis not present

## 2019-02-13 DIAGNOSIS — Z01419 Encounter for gynecological examination (general) (routine) without abnormal findings: Secondary | ICD-10-CM | POA: Diagnosis not present

## 2019-02-13 DIAGNOSIS — Z86010 Personal history of colonic polyps: Secondary | ICD-10-CM | POA: Diagnosis not present

## 2019-02-13 DIAGNOSIS — M859 Disorder of bone density and structure, unspecified: Secondary | ICD-10-CM | POA: Diagnosis not present

## 2019-02-19 LAB — CYTOLOGY - PAP
Diagnosis: NEGATIVE
HPV: NOT DETECTED

## 2019-08-12 DIAGNOSIS — Z23 Encounter for immunization: Secondary | ICD-10-CM | POA: Diagnosis not present

## 2019-10-12 DIAGNOSIS — Z20828 Contact with and (suspected) exposure to other viral communicable diseases: Secondary | ICD-10-CM | POA: Diagnosis not present

## 2019-11-19 DIAGNOSIS — R55 Syncope and collapse: Secondary | ICD-10-CM | POA: Diagnosis not present

## 2019-11-19 DIAGNOSIS — R0689 Other abnormalities of breathing: Secondary | ICD-10-CM | POA: Diagnosis not present

## 2019-11-19 DIAGNOSIS — I471 Supraventricular tachycardia: Secondary | ICD-10-CM | POA: Diagnosis not present

## 2019-11-19 DIAGNOSIS — I959 Hypotension, unspecified: Secondary | ICD-10-CM | POA: Diagnosis not present

## 2019-12-01 DIAGNOSIS — H43811 Vitreous degeneration, right eye: Secondary | ICD-10-CM | POA: Diagnosis not present

## 2019-12-01 DIAGNOSIS — Z961 Presence of intraocular lens: Secondary | ICD-10-CM | POA: Diagnosis not present

## 2019-12-01 DIAGNOSIS — H04123 Dry eye syndrome of bilateral lacrimal glands: Secondary | ICD-10-CM | POA: Diagnosis not present

## 2019-12-30 ENCOUNTER — Ambulatory Visit: Payer: PPO

## 2020-01-01 ENCOUNTER — Ambulatory Visit: Payer: PPO

## 2020-02-17 DIAGNOSIS — Z1389 Encounter for screening for other disorder: Secondary | ICD-10-CM | POA: Diagnosis not present

## 2020-02-17 DIAGNOSIS — M8580 Other specified disorders of bone density and structure, unspecified site: Secondary | ICD-10-CM | POA: Diagnosis not present

## 2020-02-17 DIAGNOSIS — Z0000 Encounter for general adult medical examination without abnormal findings: Secondary | ICD-10-CM | POA: Diagnosis not present

## 2020-02-18 ENCOUNTER — Other Ambulatory Visit: Payer: Self-pay | Admitting: Family Medicine

## 2020-02-18 DIAGNOSIS — M8580 Other specified disorders of bone density and structure, unspecified site: Secondary | ICD-10-CM

## 2020-02-20 ENCOUNTER — Other Ambulatory Visit: Payer: Self-pay | Admitting: Family Medicine

## 2020-02-20 DIAGNOSIS — Z1231 Encounter for screening mammogram for malignant neoplasm of breast: Secondary | ICD-10-CM

## 2020-03-26 ENCOUNTER — Ambulatory Visit
Admission: RE | Admit: 2020-03-26 | Discharge: 2020-03-26 | Disposition: A | Discharge status: Home / Self Care | Payer: PPO | Source: Ambulatory Visit | Attending: Family Medicine | Admitting: Family Medicine

## 2020-03-26 ENCOUNTER — Other Ambulatory Visit: Payer: Self-pay

## 2020-03-26 DIAGNOSIS — M8580 Other specified disorders of bone density and structure, unspecified site: Secondary | ICD-10-CM

## 2020-03-26 DIAGNOSIS — M8589 Other specified disorders of bone density and structure, multiple sites: Secondary | ICD-10-CM | POA: Diagnosis not present

## 2020-03-26 DIAGNOSIS — Z1231 Encounter for screening mammogram for malignant neoplasm of breast: Secondary | ICD-10-CM | POA: Diagnosis not present

## 2020-03-26 DIAGNOSIS — Z780 Asymptomatic menopausal state: Secondary | ICD-10-CM | POA: Diagnosis not present

## 2020-08-12 DIAGNOSIS — Z23 Encounter for immunization: Secondary | ICD-10-CM | POA: Diagnosis not present

## 2021-01-06 DIAGNOSIS — L720 Epidermal cyst: Secondary | ICD-10-CM | POA: Diagnosis not present

## 2021-01-06 DIAGNOSIS — L718 Other rosacea: Secondary | ICD-10-CM | POA: Diagnosis not present

## 2021-01-06 DIAGNOSIS — L821 Other seborrheic keratosis: Secondary | ICD-10-CM | POA: Diagnosis not present

## 2021-01-06 DIAGNOSIS — D225 Melanocytic nevi of trunk: Secondary | ICD-10-CM | POA: Diagnosis not present

## 2021-02-17 DIAGNOSIS — E559 Vitamin D deficiency, unspecified: Secondary | ICD-10-CM | POA: Diagnosis not present

## 2021-02-17 DIAGNOSIS — M8580 Other specified disorders of bone density and structure, unspecified site: Secondary | ICD-10-CM | POA: Diagnosis not present

## 2021-02-17 DIAGNOSIS — Z136 Encounter for screening for cardiovascular disorders: Secondary | ICD-10-CM | POA: Diagnosis not present

## 2021-02-17 DIAGNOSIS — Z0000 Encounter for general adult medical examination without abnormal findings: Secondary | ICD-10-CM | POA: Diagnosis not present

## 2021-02-18 DIAGNOSIS — Z1389 Encounter for screening for other disorder: Secondary | ICD-10-CM | POA: Diagnosis not present

## 2021-02-18 DIAGNOSIS — Z0000 Encounter for general adult medical examination without abnormal findings: Secondary | ICD-10-CM | POA: Diagnosis not present

## 2021-02-18 DIAGNOSIS — E7800 Pure hypercholesterolemia, unspecified: Secondary | ICD-10-CM | POA: Diagnosis not present

## 2021-02-18 DIAGNOSIS — K5900 Constipation, unspecified: Secondary | ICD-10-CM | POA: Diagnosis not present

## 2021-02-18 DIAGNOSIS — M8580 Other specified disorders of bone density and structure, unspecified site: Secondary | ICD-10-CM | POA: Diagnosis not present

## 2021-03-31 ENCOUNTER — Encounter: Payer: Self-pay | Admitting: Orthopaedic Surgery

## 2021-03-31 ENCOUNTER — Ambulatory Visit: Payer: PPO | Admitting: Orthopaedic Surgery

## 2021-03-31 ENCOUNTER — Ambulatory Visit (INDEPENDENT_AMBULATORY_CARE_PROVIDER_SITE_OTHER): Payer: PPO

## 2021-03-31 DIAGNOSIS — M25551 Pain in right hip: Secondary | ICD-10-CM

## 2021-03-31 DIAGNOSIS — M1611 Unilateral primary osteoarthritis, right hip: Secondary | ICD-10-CM | POA: Insufficient documentation

## 2021-03-31 NOTE — Progress Notes (Signed)
Office Visit Note   Patient: Lindsey Young           Date of Birth: 1951-01-22           MRN: [id number] Visit Date: 03/31/2021              Requested by: Cari Caraway, [place],  [place] [address] PCP: Cari Caraway, MD   Assessment & Plan: Visit Diagnoses:  1. Pain in right hip   2. Unilateral primary osteoarthritis, right hip     Plan: I went over her hip exam and x-rays in detail of her right hip.  We talked about the possibility of hip replacement surgery in the future but right now she said she is at a good spot to wear she just wants to know what she is dealing with and what activities to avoid.  We discussed those types of things.  She takes occasional Tylenol and Aleve.  Medications like glucosamine and turmeric have not helped.  We discussed her exercise routine as well.  She understands that the hip starts bothering her enough we can recommend hip replacement surgery.  She is not interested in any type of injections either because they do not work at all for her left hip.  Follow-Up Instructions: Return if symptoms worsen or fail to improve.   Orders:  Orders Placed This Encounter  Procedures  . XR HIP UNILAT W OR W/O PELVIS 1V RIGHT   No orders of the defined types were placed in this encounter.     Procedures: No procedures performed   Clinical Data: No additional findings.   Subjective: Chief Complaint  Patient presents with  . Right Hip - Pain  The patient is well-known to me.  I actually have not seen her since 2018 so she is listed as a new patient.  I replaced her left hip in 2016.  She has been having some right hip pain and stiffness for some time now.  She says that she has been sitting for a while and goes to get up her right hip is sore and painful and stiff.  She is able to walk it off easily and is still very active.  She does not feel like she is at the place where she needs a hip replacement yet and he does not feel like  it did when she had to have her left hip replaced.  She is had no other acute change in her medical status.  She is active 70 years old and not obese.  She is now diabetic and has had no acute change in her medical status.  She is not a smoker.  HPI  Review of Systems She currently denies any headache, chest pain, shortness of breath, fever, chills, nausea, vomiting  Objective: Vital Signs: There were no vitals taken for this visit.  Physical Exam She is alert and orient x3 and in no acute distress Ortho Exam Examination of her left operative hip shows it moves smoothly and fluidly.  Examination of her right hip shows just a little bit of pain at the extremes of internal extra rotation and just some slight stiffness. Specialty Comments:  No specialty comments available.  Imaging: XR HIP UNILAT W OR W/O PELVIS 1V RIGHT  Result Date: 03/31/2021 An AP pelvis and lateral of the right hip shows a normal-appearing total hip arthroplasty on the left side.  The right hip shows moderate hip joint arthritis with particular osteophytes, sclerotic changes and  joint space narrowing.    PMFS History: Patient Active Problem List   Diagnosis Date Noted  . Unilateral primary osteoarthritis, right hip 03/31/2021  . S/P left knee arthroscopy 05/30/2017  . Retained orthopedic hardware 04/10/2017  . Chronic pain of left knee 04/10/2017  . Osteoarthritis of left hip 06/18/2015  . Status post total replacement of left hip 06/18/2015  . Thrombocytopenia ([place]) 10/12/2014  . Arthritis 10/12/2014  . OSTEOARTHRITIS, HIPS, BILATERAL 08/25/2008  . FRACTURE, ANKLE, RIGHT 08/25/2008   Past Medical History:  Diagnosis Date  . Arthritis    bilateral hips  . Arthritis 10/12/2014  . Dysrhythmia    SVT (all hormone induced 7-8 years ago)  . Falls frequently    pt states has had 10 broken bones in life time  . H/O cardiovascular stress test    seen by Dr. Linard Millers for SVT- (2007 approx.), then referred to  Dr. Caryl Comes, , told not a candidate for ablation.   . H/O thyroid cyst   . Osteopenia   . PONV (postoperative nausea and vomiting)   . Thrombocytopenia ([place] [facility]) 10/12/2014    Family History  Problem Relation Age of Onset  . Cancer Father        liver cancer, DVT  . Cancer Brother        DVT and PE    Past Surgical History:  Procedure Laterality Date  . APPENDECTOMY    . COLONOSCOPY    . DILATION AND CURETTAGE OF UTERUS     secondary to polyps  . EYE SURGERY     bilat cataract surgery   . foot pinned     left   . FRACTURE SURGERY     trimalleallor fracture, foot & ankle fixation   . ORIF PATELLA Left 08/21/2013   Procedure: OPEN REDUCTION INTERNAL (ORIF) OF A LEFT PATELLA FRACTURE/POSSIBLE LEFT PARTIAL PATELLALECTOMY;  Surgeon: Marin Shutter, MD;  Location: [facility];  Service: Orthopedics;  Laterality: Left;  . TOTAL HIP ARTHROPLASTY Left 06/18/2015   Procedure: LEFT TOTAL HIP ARTHROPLASTY ANTERIOR APPROACH;  Surgeon: Mcarthur Rossetti, MD;  [facility];  Service: Orthopedics;  Laterality: Left;   Social History   Occupational History  . Not on file  Tobacco Use  . Smoking status: Former Smoker    Packs/day: 0.25    Years: 20.00    Pack years: 5.00    Types: Cigarettes    Quit date: 11/20/1993    Years since quitting: 27.3  . Smokeless tobacco: Never Used  Substance and Sexual Activity  . Alcohol use: Yes    Alcohol/week: 7.0 standard drinks    Types: 7 Glasses of wine per week    Comment: 7 glasses / week  . Drug use: No  . Sexual activity: Not on file

## 2021-08-08 ENCOUNTER — Ambulatory Visit: Payer: PPO | Admitting: Physician Assistant

## 2021-08-08 ENCOUNTER — Encounter: Payer: Self-pay | Admitting: Physician Assistant

## 2021-08-08 ENCOUNTER — Ambulatory Visit (INDEPENDENT_AMBULATORY_CARE_PROVIDER_SITE_OTHER): Payer: PPO

## 2021-08-08 DIAGNOSIS — M25572 Pain in left ankle and joints of left foot: Secondary | ICD-10-CM

## 2021-08-08 NOTE — Progress Notes (Signed)
Office Visit Note   Patient: Lindsey Young           Date of Birth: 09-18-1951           MRN: [id number] Visit Date: 08/08/2021              Requested by: Lindsey Young, Lindsey Young,  Lindsey Young [address] PCP: Lindsey Caraway, MD   Assessment & Plan: Visit Diagnoses:  1. Pain in left ankle and joints of left foot     Plan: She is placed in a cam walker boot weightbearing as tolerated.  We will have her wear this at all times when up ambulating.  She will follow-up with [place] in 2 weeks at that time we will obtain 3 views of her left foot to rule out acute fracture.  Questions were encouraged and answered at length.  Follow-Up Instructions: Return in about 2 weeks (around 08/22/2021) for Radiographs.   Orders:  Orders Placed This Encounter  Procedures   XR Foot Complete Left   No orders of the defined types were placed in this encounter.     Procedures: No procedures performed   Clinical Data: No additional findings.   Subjective: Chief Complaint  Patient presents with   Left Foot - Follow-up    HPI Lindsey Young is a 70 year old female comes in today with left foot pain.  She rolled her ankle on 08/05/2021.  She has had pain lateral aspect of the foot since that time.  She denies any significant swelling or bruising.  History of fifth metatarsal base fracture that underwent surgical fixation years ago.  She is done well until recently.  Taking no pain medications.  Sharp pain when walking lateral aspect of the foot. Review of Systems See HPI otherwise negative  Objective: Vital Signs: There were no vitals taken for this visit.  Physical Exam General well-developed well-nourished female no acute distress mood affect appropriate Psych: Alert and oriented x3 Vascular: Left foot dorsal pedal pulses 2+. Ortho Exam Left foot no rashes skin lesions ulcerations ecchymosis.  Slight edema over the lateral aspect of the foot near the base the fifth metatarsal.   She is nontender of the posterior tibial tendon medial malleolus lateral malleolus.  Tenderness over the fifth metatarsal base.  She is able to invert and evert the left foot eversion causes some discomfort. Specialty Comments:  No specialty comments available.  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  There is retained screw from previous surgery in the base the fifth metatarsal.  There is no hardware failure.  No other fractures identified.  No subluxations dislocations on the left foot.    PMFS History: Patient Active Problem List   Diagnosis Date Noted   Unilateral primary osteoarthritis, right hip 03/31/2021   S/P left knee arthroscopy 05/30/2017   Retained orthopedic hardware 04/10/2017   Chronic pain of left knee 04/10/2017   Osteoarthritis of left hip 06/18/2015   Status post total replacement of left hip 06/18/2015   Thrombocytopenia ([facility]) 10/12/2014   Arthritis 10/12/2014   OSTEOARTHRITIS, HIPS, BILATERAL 08/25/2008   FRACTURE, ANKLE, RIGHT 08/25/2008   Past Medical History:  Diagnosis Date   Arthritis    bilateral hips   Arthritis 10/12/2014   Dysrhythmia    SVT (all hormone induced 7-8 years ago)   Falls frequently    pt states has had  10 broken bones in life time   H/O cardiovascular stress test    seen by Dr. Linard Millers for SVT- (2007 approx.), then referred to Dr. Caryl Comes, , told not a candidate for ablation.    H/O thyroid cyst    Osteopenia    PONV (postoperative nausea and vomiting)    Thrombocytopenia ([place] [facility]) 10/12/2014    Family History  Problem Relation Age of Onset   Cancer Father        liver cancer, DVT   Cancer Brother        DVT and PE    Past Surgical History:  Procedure Laterality Date   APPENDECTOMY     COLONOSCOPY     DILATION AND CURETTAGE OF UTERUS     secondary to polyps   EYE SURGERY     bilat cataract surgery     foot pinned     left    FRACTURE SURGERY     trimalleallor fracture, foot & ankle fixation    ORIF PATELLA Left 08/21/2013   Procedure: OPEN REDUCTION INTERNAL (ORIF) OF A LEFT PATELLA FRACTURE/POSSIBLE LEFT PARTIAL PATELLALECTOMY;  Surgeon: Marin Shutter, MD;  Location: [facility];  Service: Orthopedics;  Laterality: Left;   TOTAL HIP ARTHROPLASTY Left 06/18/2015   Procedure: LEFT TOTAL HIP ARTHROPLASTY ANTERIOR APPROACH;  Surgeon: Mcarthur Rossetti, MD;  [facility];  Service: Orthopedics;  Laterality: Left;   Social History   Occupational History   Not on file  Tobacco Use   Smoking status: Former    Packs/day: 0.25    Years: 20.00    Pack years: 5.00    Types: Cigarettes    Quit date: 11/20/1993    Years since quitting: 27.7   Smokeless tobacco: Never  Substance and Sexual Activity   Alcohol use: Yes    Alcohol/week: 7.0 standard drinks    Types: 7 Glasses of wine per week    Comment: 7 glasses / week   Drug use: No   Sexual activity: Not on file

## 2021-08-13 DIAGNOSIS — Z23 Encounter for immunization: Secondary | ICD-10-CM | POA: Diagnosis not present

## 2021-08-22 ENCOUNTER — Ambulatory Visit (INDEPENDENT_AMBULATORY_CARE_PROVIDER_SITE_OTHER): Payer: PPO | Admitting: Physician Assistant

## 2021-08-22 ENCOUNTER — Encounter: Payer: Self-pay | Admitting: Physician Assistant

## 2021-08-22 ENCOUNTER — Ambulatory Visit (INDEPENDENT_AMBULATORY_CARE_PROVIDER_SITE_OTHER): Payer: PPO

## 2021-08-22 DIAGNOSIS — M25572 Pain in left ankle and joints of left foot: Secondary | ICD-10-CM

## 2021-08-22 DIAGNOSIS — S92355A Nondisplaced fracture of fifth metatarsal bone, left foot, initial encounter for closed fracture: Secondary | ICD-10-CM | POA: Diagnosis not present

## 2021-08-22 NOTE — Progress Notes (Signed)
HPI: Mrs. Lindsey Young returns today for follow-up of her left ankle and foot pain.  She feels like overall that her foot pain is improving.  She did some blood disc which had left ankle and left foot films from 2013.  On the lateral view of the 2013 foot radiographs there was lucency consistent with nonunion or fibrous union.  However on the AP and oblique view of the left foot no fracture is not seen. In regards to her foot pain she again feels that it is getting better and that she has had decreased swelling and bruising.  She is ambulating in a cam walker boot.  Review of systems: See HPI otherwise negative  Physical exam: Left foot tenderness over the base of the fifth metatarsal.  Otherwise good range of motion of the ankle.  She is able to bear full weight without any assistive device when in the cam walker boot.   Left foot 3 views: Acute fractures base of the fifth metatarsal base.  No hardware failure.  There is a retained screw from previous fifth metatarsal open reduction internal fixation procedure.  No other fractures identified.  Impression: Acute fifth metatarsal base fracture  Plan: Continue cam walker boot weightbearing as tolerated.  We will have her back in 4 weeks obtain 3 views of the right foot at that time.  Questions were encouraged and answered at length.  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

## 2021-09-19 ENCOUNTER — Ambulatory Visit: Payer: Self-pay

## 2021-09-19 ENCOUNTER — Other Ambulatory Visit: Payer: Self-pay

## 2021-09-19 ENCOUNTER — Encounter: Payer: Self-pay | Admitting: Orthopaedic Surgery

## 2021-09-19 ENCOUNTER — Ambulatory Visit: Payer: PPO | Admitting: Orthopaedic Surgery

## 2021-09-19 DIAGNOSIS — M25572 Pain in left ankle and joints of left foot: Secondary | ICD-10-CM

## 2021-09-19 DIAGNOSIS — S92355A Nondisplaced fracture of fifth metatarsal bone, left foot, initial encounter for closed fracture: Secondary | ICD-10-CM

## 2021-09-19 NOTE — Progress Notes (Signed)
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  She has been in a cam walking boot and says she still has just a little bit of pain but is improving.  She has had pain for many years now due to the fracture nonunion but it has been something that she accommodates for easily.  She is 70 years old.  She says the boot is causing her more issues because she has a high arch.  On exam I stressed the fifth metatarsal area of her left foot and she did not exhibit any pain in this area at all.  3 views left foot obtained and show intact hardware from an old fracture fixation.  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  This point she can stop the boot and transition back to regular shoes since he is doing well.  She will avoid high impact aerobic activities.  Follow-up can be as needed.

## 2021-10-24 ENCOUNTER — Ambulatory Visit: Payer: PPO | Admitting: Physician Assistant

## 2022-01-03 DIAGNOSIS — R109 Unspecified abdominal pain: Secondary | ICD-10-CM | POA: Diagnosis not present

## 2022-01-03 DIAGNOSIS — R1030 Lower abdominal pain, unspecified: Secondary | ICD-10-CM | POA: Diagnosis not present

## 2022-02-22 DIAGNOSIS — E559 Vitamin D deficiency, unspecified: Secondary | ICD-10-CM | POA: Diagnosis not present

## 2022-02-22 DIAGNOSIS — M8580 Other specified disorders of bone density and structure, unspecified site: Secondary | ICD-10-CM | POA: Diagnosis not present

## 2022-02-22 DIAGNOSIS — E7800 Pure hypercholesterolemia, unspecified: Secondary | ICD-10-CM | POA: Diagnosis not present

## 2022-02-23 ENCOUNTER — Other Ambulatory Visit: Payer: Self-pay | Admitting: Family Medicine

## 2022-02-23 DIAGNOSIS — M8580 Other specified disorders of bone density and structure, unspecified site: Secondary | ICD-10-CM

## 2022-02-23 DIAGNOSIS — Z1389 Encounter for screening for other disorder: Secondary | ICD-10-CM | POA: Diagnosis not present

## 2022-02-23 DIAGNOSIS — E7800 Pure hypercholesterolemia, unspecified: Secondary | ICD-10-CM | POA: Diagnosis not present

## 2022-02-23 DIAGNOSIS — Z0000 Encounter for general adult medical examination without abnormal findings: Secondary | ICD-10-CM | POA: Diagnosis not present

## 2022-02-23 DIAGNOSIS — Z01419 Encounter for gynecological examination (general) (routine) without abnormal findings: Secondary | ICD-10-CM | POA: Diagnosis not present

## 2022-02-23 DIAGNOSIS — R1030 Lower abdominal pain, unspecified: Secondary | ICD-10-CM | POA: Diagnosis not present

## 2022-02-24 ENCOUNTER — Other Ambulatory Visit: Payer: Self-pay | Admitting: Family Medicine

## 2022-02-24 DIAGNOSIS — Z1231 Encounter for screening mammogram for malignant neoplasm of breast: Secondary | ICD-10-CM

## 2022-02-28 ENCOUNTER — Ambulatory Visit
Admission: RE | Admit: 2022-02-28 | Discharge: 2022-02-28 | Disposition: A | Discharge status: Home / Self Care | Payer: PPO | Source: Ambulatory Visit | Attending: Family Medicine | Admitting: Family Medicine

## 2022-02-28 DIAGNOSIS — Z1231 Encounter for screening mammogram for malignant neoplasm of breast: Secondary | ICD-10-CM

## 2022-03-01 ENCOUNTER — Other Ambulatory Visit: Payer: Self-pay | Admitting: Family Medicine

## 2022-03-01 DIAGNOSIS — R928 Other abnormal and inconclusive findings on diagnostic imaging of breast: Secondary | ICD-10-CM

## 2022-03-16 ENCOUNTER — Ambulatory Visit
Admission: RE | Admit: 2022-03-16 | Discharge: 2022-03-16 | Disposition: A | Discharge status: Home / Self Care | Payer: PPO | Source: Ambulatory Visit | Attending: Family Medicine | Admitting: Family Medicine

## 2022-03-16 DIAGNOSIS — R928 Other abnormal and inconclusive findings on diagnostic imaging of breast: Secondary | ICD-10-CM | POA: Diagnosis not present

## 2022-03-16 DIAGNOSIS — N6489 Other specified disorders of breast: Secondary | ICD-10-CM | POA: Diagnosis not present

## 2022-04-03 ENCOUNTER — Ambulatory Visit
Admission: RE | Admit: 2022-04-03 | Discharge: 2022-04-03 | Disposition: A | Discharge status: Home / Self Care | Payer: PPO | Source: Ambulatory Visit | Attending: Family Medicine | Admitting: Family Medicine

## 2022-04-03 ENCOUNTER — Other Ambulatory Visit: Payer: Self-pay | Admitting: Family Medicine

## 2022-04-03 DIAGNOSIS — M7989 Other specified soft tissue disorders: Secondary | ICD-10-CM | POA: Diagnosis not present

## 2022-04-03 DIAGNOSIS — S99922A Unspecified injury of left foot, initial encounter: Secondary | ICD-10-CM | POA: Diagnosis not present

## 2022-04-03 DIAGNOSIS — M79672 Pain in left foot: Secondary | ICD-10-CM | POA: Diagnosis not present

## 2022-04-03 DIAGNOSIS — T1490XA Injury, unspecified, initial encounter: Secondary | ICD-10-CM

## 2022-04-19 ENCOUNTER — Ambulatory Visit: Payer: Self-pay

## 2022-04-19 ENCOUNTER — Ambulatory Visit: Payer: PPO | Admitting: Physician Assistant

## 2022-04-19 ENCOUNTER — Encounter: Payer: Self-pay | Admitting: Physician Assistant

## 2022-04-19 DIAGNOSIS — S92355A Nondisplaced fracture of fifth metatarsal bone, left foot, initial encounter for closed fracture: Secondary | ICD-10-CM

## 2022-04-19 NOTE — Progress Notes (Signed)
HPI: Mrs. Neises comes in today due to left foot pain.  She states 3 weeks ago she dropped a bottle on her foot.  Started having pain few days later.  Had radiographs of her foot obtained on 04/03/2022.  These radiographs were reviewed and showed an acute nondisplaced fracture involving the first metatarsal midshaft.  No other fractures identified.  Retained hardware fifth metatarsal from previous fracture seen.  Review of systems: See HPI  Physical exam: Left foot no rashes skin lesions ulcerations or ecchymosis.  Tenderness first metatarsal only.  Dorsal pedal pulses intact.   Radiographs 3 views left foot: Minimally displaced fracture with some early callus formation first metatarsal shaft.  No other fractures dislocations noted.  Taking screw from previous open reduction internal fixation fifth metatarsal fracture.   Impression: Left foot first metatarsal fracture   Plan: Place her in a postop shoe weightbearing as tolerated.  She needs to wear this whenever she is up ambulating.  We will see her back in 1 month and obtain 3 views of the left foot.  Questions were encouraged and answered.

## 2022-05-15 ENCOUNTER — Ambulatory Visit: Payer: PPO | Admitting: Physician Assistant

## 2022-08-01 DIAGNOSIS — H0014 Chalazion left upper eyelid: Secondary | ICD-10-CM | POA: Diagnosis not present

## 2022-08-01 DIAGNOSIS — Z23 Encounter for immunization: Secondary | ICD-10-CM | POA: Diagnosis not present

## 2022-08-01 DIAGNOSIS — Z6823 Body mass index (BMI) 23.0-23.9, adult: Secondary | ICD-10-CM | POA: Diagnosis not present

## 2022-08-17 ENCOUNTER — Ambulatory Visit
Admission: RE | Admit: 2022-08-17 | Discharge: 2022-08-17 | Disposition: A | Discharge status: Home / Self Care | Payer: PPO | Source: Ambulatory Visit | Attending: Family Medicine | Admitting: Family Medicine

## 2022-08-17 DIAGNOSIS — Z780 Asymptomatic menopausal state: Secondary | ICD-10-CM | POA: Diagnosis not present

## 2022-08-17 DIAGNOSIS — M8580 Other specified disorders of bone density and structure, unspecified site: Secondary | ICD-10-CM

## 2022-08-17 DIAGNOSIS — M8589 Other specified disorders of bone density and structure, multiple sites: Secondary | ICD-10-CM | POA: Diagnosis not present

## 2022-09-04 DIAGNOSIS — M85851 Other specified disorders of bone density and structure, right thigh: Secondary | ICD-10-CM | POA: Diagnosis not present

## 2022-09-04 DIAGNOSIS — Z6823 Body mass index (BMI) 23.0-23.9, adult: Secondary | ICD-10-CM | POA: Diagnosis not present

## 2022-10-04 IMAGING — MG MM DIGITAL SCREENING BILAT W/ TOMO AND CAD
8 series · 9 of 24 positions shown · non-contrast
Comparison: Previous exam(s).

CLINICAL DATA: Screening.

EXAM:
DIGITAL SCREENING BILATERAL MAMMOGRAM WITH TOMOSYNTHESIS AND CAD
TECHNIQUE: Bilateral screening digital craniocaudal and mediolateral oblique
mammograms were obtained. Bilateral screening digital breast
tomosynthesis was performed. The images were evaluated with
computer-aided detection.

[L CC synth-2D]
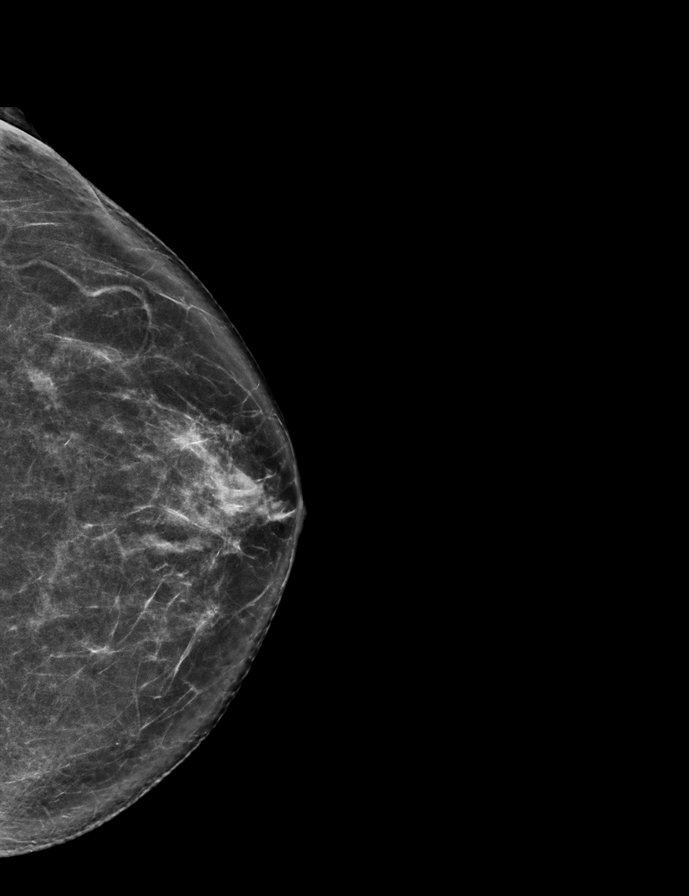

[L MLO synth-2D]
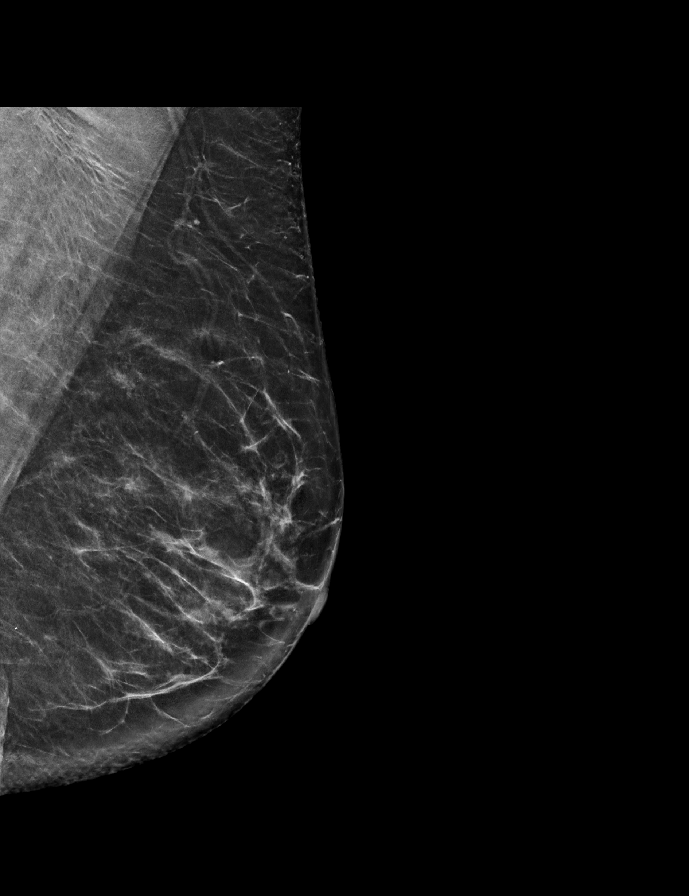

[R MLO synth-2D]
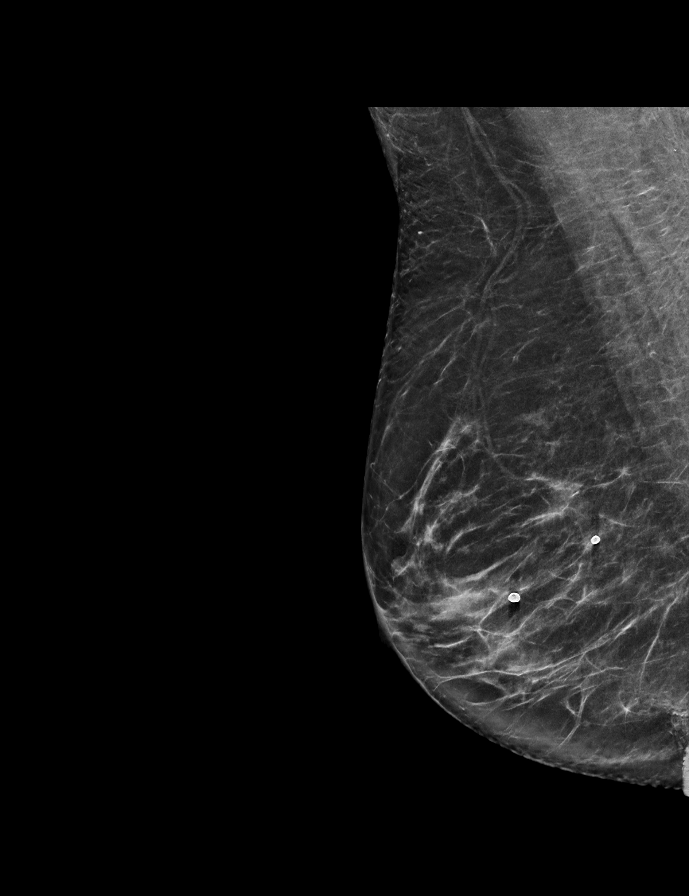

[R CC synth-2D]
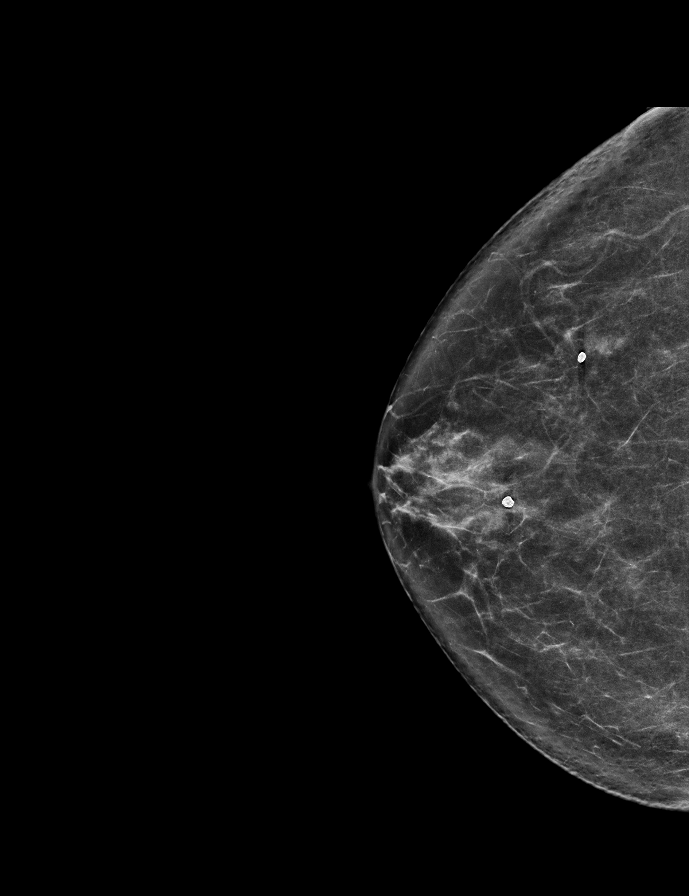

[L CC tomo · 2 of 72 frames shown]
[frame 24/72]
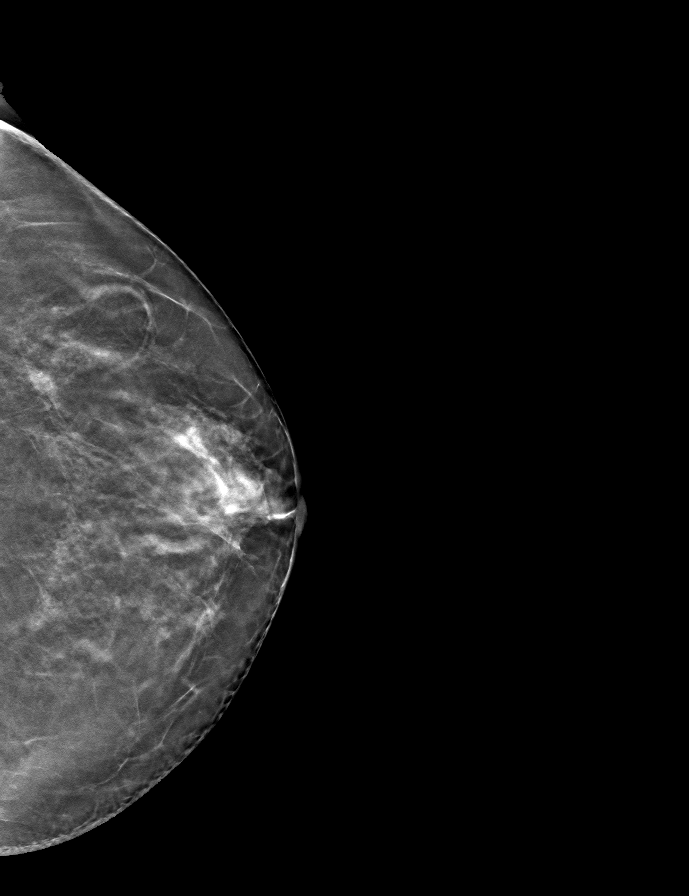
[frame 37/72]
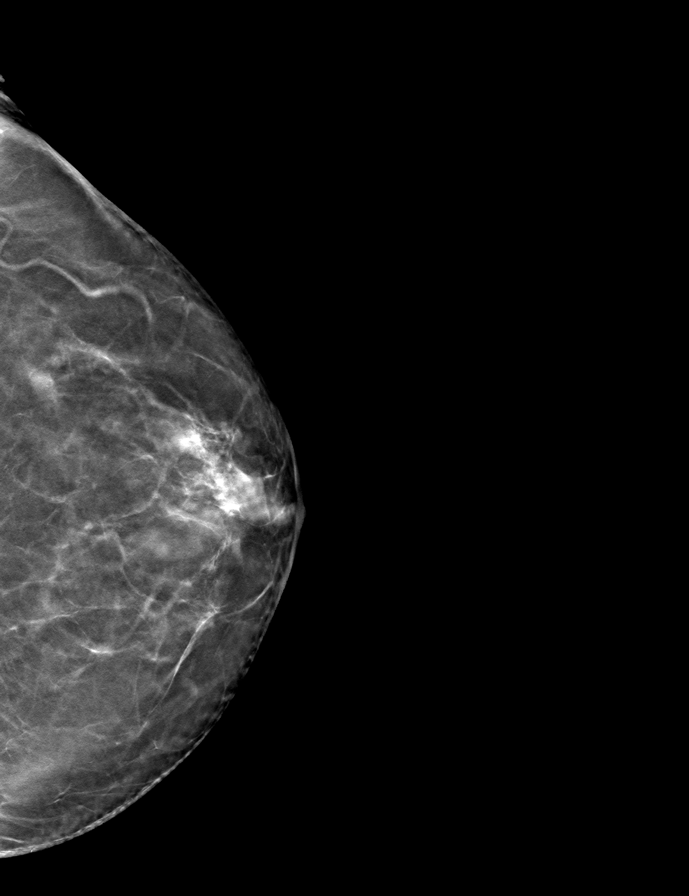

[R CC tomo · tomo slice 35/70.0]
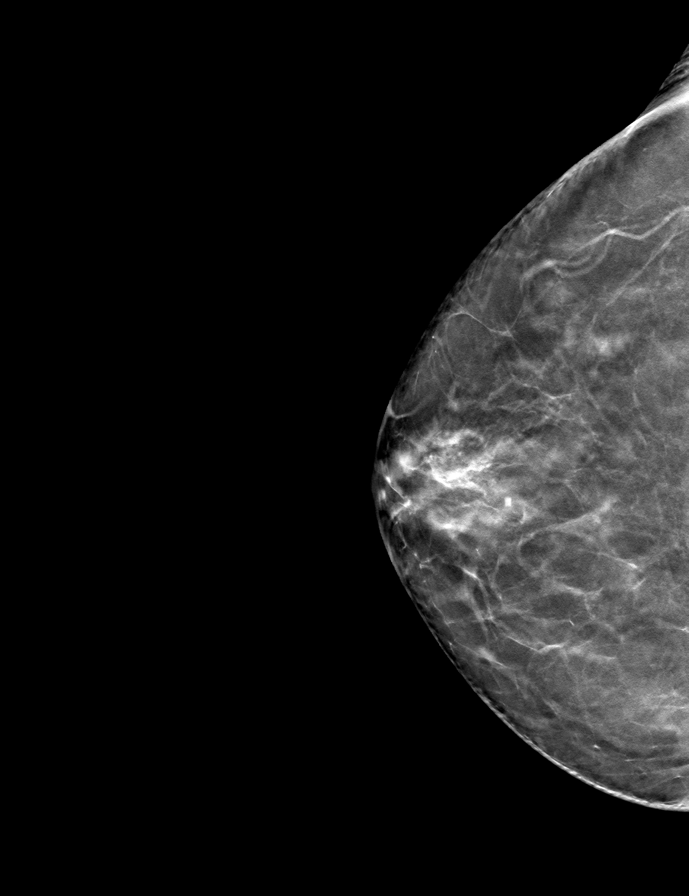

[R MLO tomo · tomo slice 36/71.0]
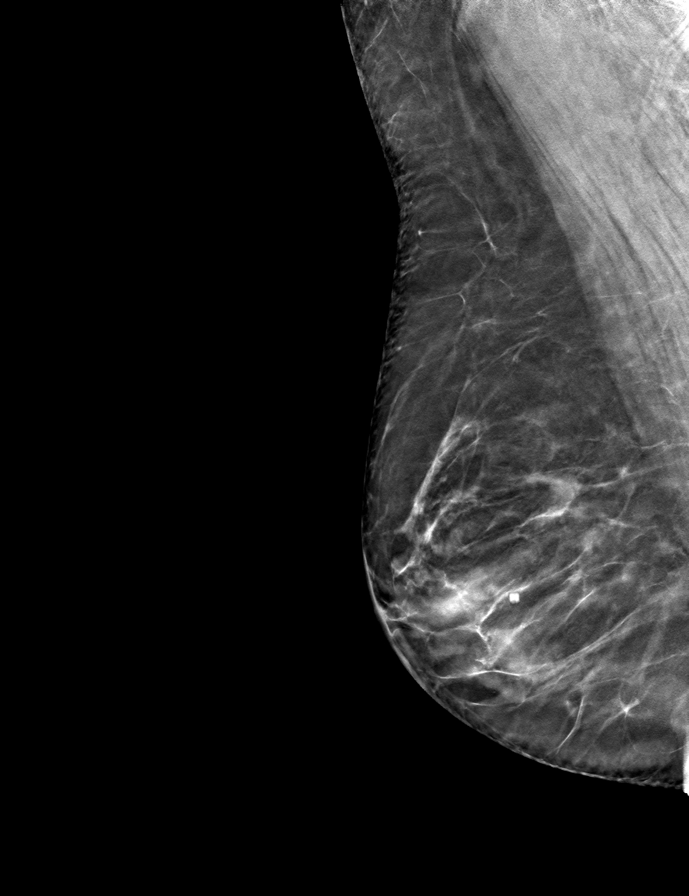

[L MLO tomo · tomo slice 39/77.0]
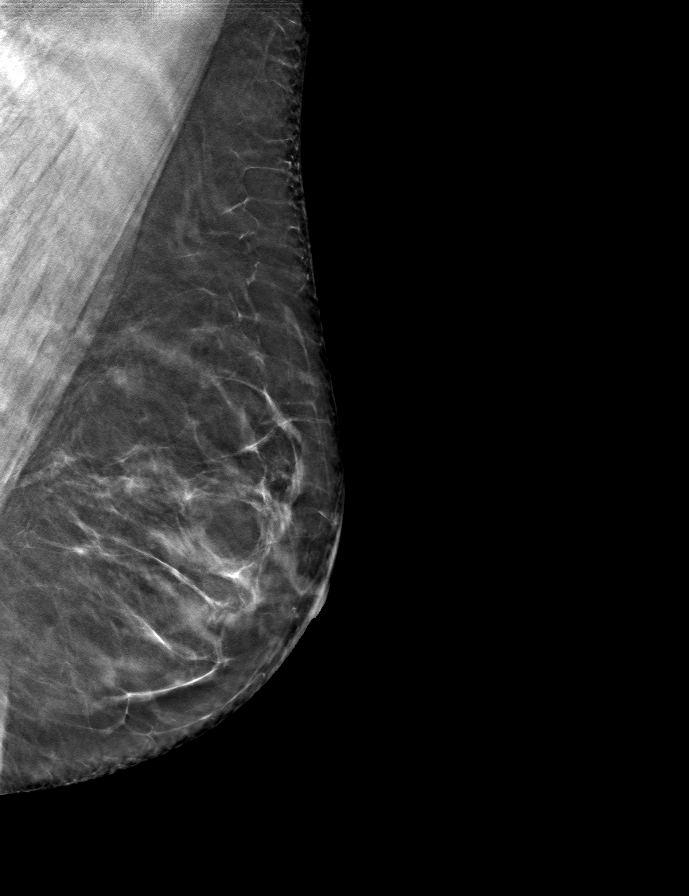

[9 of 24 positions shown; findings below may reference images not displayed]

ACR Breast Density Category b: There are scattered areas of
fibroglandular density.
FINDINGS: In the left breast, a possible asymmetry warrants further
evaluation. In the right breast, no findings suspicious for
malignancy.
IMPRESSION: Further evaluation is suggested for possible asymmetry in the left
breast.

RECOMMENDATION:
Diagnostic mammogram and possibly ultrasound of the left breast.
(Code:[id number])

The patient will be contacted regarding the findings, and additional
imaging will be scheduled.

BI-RADS CATEGORY  0: Incomplete. Need additional imaging evaluation
and/or prior mammograms for comparison.

## 2023-01-25 ENCOUNTER — Encounter: Payer: Self-pay | Admitting: Radiology

## 2023-02-05 ENCOUNTER — Other Ambulatory Visit (INDEPENDENT_AMBULATORY_CARE_PROVIDER_SITE_OTHER): Payer: PPO

## 2023-02-05 ENCOUNTER — Ambulatory Visit (INDEPENDENT_AMBULATORY_CARE_PROVIDER_SITE_OTHER): Payer: PPO | Admitting: Orthopaedic Surgery

## 2023-02-05 ENCOUNTER — Encounter: Payer: Self-pay | Admitting: Orthopaedic Surgery

## 2023-02-05 DIAGNOSIS — M25551 Pain in right hip: Secondary | ICD-10-CM | POA: Diagnosis not present

## 2023-02-05 NOTE — Progress Notes (Signed)
Although the patient is listed as a new patient we actually replaced her left hip in 2016.  That has done well.  She is an active 72 year old female who is been getting worsening right hip pain for many years now.  She now has episodes where she is sitting and goes to stand with the hip is very stiff and painful.  It hurts with her activities of daily living as well as her working out activities.  She walks with a limp now and she has significant groin pain.  She has tried and failed all forms conservative treatment.  At this point her right hip pain is detrimentally affecting her mobility, her quality of life and her actives of daily living.  She is also someone who had a severe allergic reaction to a steroid injection in her left hip so defers this and I agree with this medically that it is not warranted given her allergic reaction to steroids.  She is not a diabetic.  She is thin.  She has had no acute change in her medical status either.  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  Her right hip has significant stiffness with rotation and significant pain in the groin with internal and external rotation.  Standing AP pelvis lateral the right hip shows bone-on-bone wear of the right hip.  There are large osteophytes around the hip joint and superior lateral narrowing.  This is worsened when compared to films from 2017.  This point I agree with proceeding with a right total hip arthroplasty in the near future.  Having had this before she is fully aware of the risk and benefits of surgery and what to expect from an intraoperative and postoperative course.  We will work on getting this scheduled.  All question concerns were answered and addressed.

## 2023-02-27 DIAGNOSIS — E559 Vitamin D deficiency, unspecified: Secondary | ICD-10-CM | POA: Diagnosis not present

## 2023-02-27 DIAGNOSIS — M8580 Other specified disorders of bone density and structure, unspecified site: Secondary | ICD-10-CM | POA: Diagnosis not present

## 2023-02-27 DIAGNOSIS — E7800 Pure hypercholesterolemia, unspecified: Secondary | ICD-10-CM | POA: Diagnosis not present

## 2023-03-05 ENCOUNTER — Other Ambulatory Visit (HOSPITAL_COMMUNITY): Payer: Self-pay | Admitting: Pain Medicine

## 2023-03-05 DIAGNOSIS — E785 Hyperlipidemia, unspecified: Secondary | ICD-10-CM

## 2023-03-05 DIAGNOSIS — Z0000 Encounter for general adult medical examination without abnormal findings: Secondary | ICD-10-CM | POA: Diagnosis not present

## 2023-03-05 DIAGNOSIS — Z23 Encounter for immunization: Secondary | ICD-10-CM | POA: Diagnosis not present

## 2023-03-05 DIAGNOSIS — Z1211 Encounter for screening for malignant neoplasm of colon: Secondary | ICD-10-CM | POA: Diagnosis not present

## 2023-03-05 DIAGNOSIS — Z6823 Body mass index (BMI) 23.0-23.9, adult: Secondary | ICD-10-CM | POA: Diagnosis not present

## 2023-03-05 DIAGNOSIS — Z1239 Encounter for other screening for malignant neoplasm of breast: Secondary | ICD-10-CM | POA: Diagnosis not present

## 2023-03-06 ENCOUNTER — Other Ambulatory Visit: Payer: Self-pay | Admitting: Pain Medicine

## 2023-03-06 DIAGNOSIS — Z1239 Encounter for other screening for malignant neoplasm of breast: Secondary | ICD-10-CM

## 2023-03-30 NOTE — Progress Notes (Addendum)
COVID Vaccine received:  []  No [x]  Yes Date of any COVID positive Test in last 90 days:  none  PCP - Gweneth Dimitri, MD  [phone] Cardiologist - Sherryl Manges, MD  Chest x-ray -  EKG - 06-08-2015    will repeat at PST  Stress Test -  ECHO -  Cardiac Cath -  CTA Coronary Calcium Score- 0  PCR screen: [x]  Ordered & Completed           []   No Order but Needs PROFEND           []   N/A for this surgery  Surgery Plan:  []  Ambulatory                            [x]  Outpatient in bed                            []  Admit  Anesthesia:    []  General  [x]  Spinal                           []   Choice []   MAC  Pacemaker / ICD device [x]  No []  Yes   Spinal Cord Stimulator:[x]  No []  Yes       History of Sleep Apnea? [x]  No []  Yes   CPAP used?- [x]  No []  Yes    Does the patient monitor blood sugar?          []  No []  Yes  [x]  N/A  Patient has: [x]  NO Hx DM   []  Pre-DM                 []  DM1  []   DM2  Blood Thinner / Instructions:  none Aspirin Instructions:  none  ERAS Protocol Ordered: []  No  [x]  Yes PRE-SURGERY [x]  ENSURE  []  G2  Patient is to be NPO after: 04:15 am  Comments: Patient was given the 5 CHG shower / bath instructions for THA surgery along with 2 bottles of the CHG soap. Patient will start this on:  Monday  Apr 09, 2023    Patient voiced understanding of this process.   Activity level: Patient is able to climb a flight of stairs without difficulty; [x]  No CP  [x]  No SOB, but would have leg pain   Patient can perform ADLs without assistance.   Anesthesia review: Thrombocytopenia, Hx SVT (Saw Dr. Graciela Husbands, not able to have ablation)  CT cardiac scoring on 04-05-2023 was 0 score  Patient denies shortness of breath, fever, cough and chest pain at PAT appointment.  Patient verbalized understanding and agreement to the Pre-Surgical Instructions that were given to them at this PAT appointment. Patient was also educated of the need to review these PAT instructions again prior to her  surgery.I reviewed the appropriate phone numbers to call if they have any and questions or concerns.

## 2023-03-30 NOTE — Patient Instructions (Addendum)
SURGICAL WAITING ROOM VISITATION Patients having surgery or a procedure may have no more than 2 support people in the waiting area - these visitors may rotate in the visitor waiting room.   Due to an increase in RSV and influenza rates and associated hospitalizations, children ages 105 and under may not visit patients in [facility] hospitals. If the patient needs to stay at the hospital during part of their recovery, the visitor guidelines for inpatient rooms apply.  PRE-OP VISITATION  Pre-op nurse will coordinate an appropriate time for 1 support person to accompany the patient in pre-op.  This support person may not rotate.  This visitor will be contacted when the time is appropriate for the visitor to come back in the pre-op area.  Please refer to the [facility] website for the visitor guidelines for Inpatients (after your surgery is over and you are in a regular room).  You are not required to quarantine at this time prior to your surgery. However, you must do this: Hand Hygiene often Do NOT share personal items Notify your provider if you are in close contact with someone who has COVID or you develop fever 100.4 or greater, new onset of sneezing, cough, sore throat, shortness of breath or body aches.  If you test positive for Covid or have been in contact with anyone that has tested positive in the last 10 days please notify you surgeon.    Your procedure is scheduled on:  Friday   Apr 13, 2023  Report to [facility] Main Entrance: [facility] entrance where the [employer] is available.   Report to admitting at:   05:15  AM  +++++Call this number if you have any questions or problems the morning of surgery [phone]  Do not eat food after Midnight the night prior to your surgery/procedure.  After Midnight you may have the following liquids until  04:15  AM  DAY OF SURGERY  Clear Liquid Diet Water Black Coffee (sugar ok, NO MILK/CREAM OR CREAMERS)  Tea (sugar ok, NO  MILK/CREAM OR CREAMERS) regular and decaf                             Plain Jell-O  with no fruit (NO RED)                                           Fruit ices (not with fruit pulp, NO RED)                                     Popsicles (NO RED)                                                                  Juice: apple, WHITE grape, WHITE cranberry Sports drinks like Gatorade or Powerade (NO RED)                   The day of surgery:  Drink ONE (1) Pre-Surgery Clear Ensure or G2 at  AM the morning of surgery. Drink in one sitting. Do not sip.  This drink was given to you during your hospital pre-op appointment visit. Nothing else to drink after completing the Pre-Surgery Clear Ensure or G2 : No candy, chewing gum or throat lozenges.    FOLLOW BOWEL PREP AND ANY ADDITIONAL PRE OP INSTRUCTIONS YOU RECEIVED FROM YOUR SURGEON'S OFFICE!!!   Oral Hygiene is also important to reduce your risk of infection.        Remember - BRUSH YOUR TEETH THE MORNING OF SURGERY WITH YOUR REGULAR TOOTHPASTE  Do NOT smoke after Midnight the night before surgery.  Take ONLY these medicines the morning of surgery with A SIP OF WATER:    If You have been diagnosed with Sleep Apnea - Bring CPAP mask and tubing day of surgery. We will provide you with a CPAP machine on the day of your surgery.                   You may not have any metal on your body including hair pins, jewelry, and body piercing  Do not wear make-up, lotions, powders, perfumes / cologne, or deodorant  Do not wear nail polish including gel and S&S, artificial / acrylic nails, or any other type of covering on natural nails including finger and toenails. If you have artificial nails, gel coating, etc., that needs to be removed by a nail salon, Please have this removed prior to surgery. Not doing so may mean that your surgery could be cancelled or delayed if the Surgeon or anesthesia staff feels like they are unable to monitor you safely.    Do not shave 48 hours prior to surgery to avoid nicks in your skin which may contribute to postoperative infections.   Men may shave face and neck.  Contacts, Hearing Aids, dentures or bridgework may not be worn into surgery. DENTURES WILL BE REMOVED PRIOR TO SURGERY PLEASE DO NOT APPLY "Poly grip" OR ADHESIVES!!!  You may bring a small overnight bag with you on the day of surgery, only pack items that are not valuable. [facility] IS NOT RESPONSIBLE   FOR VALUABLES THAT ARE LOST OR STOLEN.   Patients discharged on the day of surgery will not be allowed to drive home.  Someone NEEDS to stay with you for the first 24 hours after anesthesia.  Do not bring your home medications to the hospital. The Pharmacy will dispense medications listed on your medication list to you during your admission in the Hospital.  Special Instructions: Bring a copy of your healthcare power of attorney and living will documents the day of surgery, if you wish to have them scanned into your [facility] Records- EPIC  Please read over the following fact sheets you were given: IF YOU HAVE QUESTIONS ABOUT YOUR PRE-OP INSTRUCTIONS, PLEASE CALL [phone].   +++++++ PLEASE FOLLOW THE FOLLOWING  INFORMATION REGARDING SHOWERING / BATHING SCHEDULE  PRIOR TO YOUR SURGERY. Start this schedule on :   Monday  Apr 09, 2023    Pre-operative 5 CHG Bath Instructions   You can play a key role in reducing the risk of infection after surgery. Your skin needs to be as free of germs as possible. You can reduce the number of germs on your skin by washing with CHG (chlorhexidine gluconate) soap before surgery. CHG is an antiseptic soap that kills germs and continues to kill germs even after washing.   DO NOT use if you have an allergy to chlorhexidine/CHG  or antibacterial soaps. If your skin becomes reddened or irritated, stop using the CHG and notify one of our RNs at [phone]  Please shower with the CHG soap starting 4  days before surgery using the following schedule:   START THIS ON MONDAY  Apr 09, 2023                                                                                                                                                  04-09-23            04-10-23      04-11-23          04-12-23              Surgery day 04-13-2023       Please keep in mind the following:  DO NOT shave, including legs and underarms, starting the day of your first shower.   You may shave your face at any point before/day of surgery.   Place clean sheets on your bed the day you start using CHG soap. Use a clean washcloth (not used since being washed) for each shower. DO NOT sleep with pets once you start using the CHG.   CHG Shower Instructions:  If you choose to wash your hair and private area, wash first with your normal shampoo/soap.  After you use shampoo/soap, rinse your hair and body thoroughly to remove shampoo/soap residue.  Turn the water OFF and apply about 3 tablespoons (45 ml) of CHG soap to a CLEAN washcloth.  Apply CHG soap ONLY FROM YOUR NECK DOWN TO YOUR TOES (washing for 3-5 minutes)  DO NOT use CHG soap on face, private areas, open wounds, or sores.  Pay special attention to the area where your surgery is being performed.  If you are having back surgery, having someone wash your back for you may be helpful. Wait 2 minutes after CHG soap is applied, then you may rinse off the CHG soap.  Pat dry with a clean towel  Put on clean clothes/pajamas   If you choose to wear lotion on the 4 days prior to surgery, please use ONLY the CHG-compatible lotions on the back of this paper.     Additional instructions for the DAY OF SURGERY: DO NOT APPLY any lotions, deodorants, cologne, or perfumes.   Put on clean/comfortable clothes.  Brush your teeth.  Ask your nurse before applying any prescription medications to the skin.      CHG Compatible Lotions   Aveeno Moisturizing lotion  Cetaphil  Moisturizing Cream  Cetaphil Moisturizing Lotion  Clairol Herbal Essence Moisturizing Lotion, Dry Skin  Clairol Herbal Essence Moisturizing Lotion, Extra Dry Skin  Clairol Herbal Essence Moisturizing Lotion, Normal Skin  Curel Age Defying Therapeutic Moisturizing Lotion with Alpha Hydroxy  Curel Extreme Care Body Lotion  Curel Soothing Hands Moisturizing Hand Lotion  Curel Therapeutic Moisturizing Cream, Fragrance-Free  Curel Therapeutic Moisturizing Lotion, Fragrance-Free  Curel Therapeutic Moisturizing Lotion, Original Formula  Eucerin Daily Replenishing Lotion  Eucerin Dry Skin Therapy Plus Alpha Hydroxy Crme  Eucerin Dry Skin Therapy Plus Alpha Hydroxy Lotion  Eucerin Original Crme  Eucerin Original Lotion  Eucerin Plus Crme Eucerin Plus Lotion  Eucerin TriLipid Replenishing Lotion  Keri Anti-Bacterial Hand Lotion  Keri Deep Conditioning Original Lotion Dry Skin Formula Softly Scented  Keri Deep Conditioning Original Lotion, Fragrance Free Sensitive Skin Formula  Keri Lotion Fast Absorbing Fragrance Free Sensitive Skin Formula  Keri Lotion Fast Absorbing Softly Scented Dry Skin Formula  Keri Original Lotion  Keri Skin Renewal Lotion Keri Silky Smooth Lotion  Keri Silky Smooth Sensitive Skin Lotion  Nivea Body Creamy Conditioning Oil  Nivea Body Extra Enriched Lotion  Nivea Body Original Lotion  Nivea Body Sheer Moisturizing Lotion Nivea Crme  Nivea Skin Firming Lotion  NutraDerm 30 Skin Lotion  NutraDerm Skin Lotion  NutraDerm Therapeutic Skin Cream  NutraDerm Therapeutic Skin Lotion  ProShield Protective Hand Cream  Provon moisturizing lotion    ON THE DAY OF SURGERY : Do not apply any lotions/deodorants the morning of surgery.  Please wear clean clothes to the hospital/surgery center.    FAILURE TO FOLLOW THESE INSTRUCTIONS MAY RESULT IN THE CANCELLATION OF YOUR SURGERY  PATIENT SIGNATURE_________________________________  NURSE  SIGNATURE__________________________________  ________________________________________________________________________      Rogelia Mire    An incentive spirometer is a tool that can help keep your lungs clear and active. This tool measures how well you are filling your lungs with each breath. Taking long deep breaths may help reverse or decrease the chance of developing breathing (pulmonary) problems (especially infection) following: A long period of time when you are unable to move or be active. BEFORE THE PROCEDURE  If the spirometer includes an indicator to show your best effort, your nurse or respiratory therapist will set it to a desired goal. If possible, sit up straight or lean slightly forward. Try not to slouch. Hold the incentive spirometer in an upright position. INSTRUCTIONS FOR USE  Sit on the edge of your bed if possible, or sit up as far as you can in bed or on a chair. Hold the incentive spirometer in an upright position. Breathe out normally. Place the mouthpiece in your mouth and seal your lips tightly around it. Breathe in slowly and as deeply as possible, raising the piston or the ball toward the top of the column. Hold your breath for 3-5 seconds or for as long as possible. Allow the piston or ball to fall to the bottom of the column. Remove the mouthpiece from your mouth and breathe out normally. Rest for a few seconds and repeat Steps 1 through 7 at least 10 times every 1-2 hours when you are awake. Take your time and take a few normal breaths between deep breaths. The spirometer may include an indicator to show your best effort. Use the indicator as a goal to work toward during each repetition. After each set of 10 deep breaths, practice coughing to be sure your lungs are clear. If you have an incision (the cut made at the time of surgery), support your incision when coughing by placing a pillow or rolled up towels firmly against it. Once you are able to  get out of bed, walk around indoors and cough well. You may stop using the incentive spirometer when instructed by your caregiver.  RISKS AND COMPLICATIONS Take your time  so you do not get dizzy or light-headed. If you are in pain, you may need to take or ask for pain medication before doing incentive spirometry. It is harder to take a deep breath if you are having pain. AFTER USE Rest and breathe slowly and easily. It can be helpful to keep track of a log of your progress. Your caregiver can provide you with a simple table to help with this. If you are using the spirometer at home, follow these instructions: SEEK MEDICAL CARE IF:  You are having difficultly using the spirometer. You have trouble using the spirometer as often as instructed. Your pain medication is not giving enough relief while using the spirometer. You develop fever of 100.5 F (38.1 C) or higher.                                                                                                    SEEK IMMEDIATE MEDICAL CARE IF:  You cough up bloody sputum that had not been present before. You develop fever of 102 F (38.9 C) or greater. You develop worsening pain at or near the incision site. MAKE SURE YOU:  Understand these instructions. Will watch your condition. Will get help right away if you are not doing well or get worse. Document Released: 03/19/2007 Document Revised: 01/29/2012 Document Reviewed: 05/20/2007 [facility] Patient Information 2014 [place], [place].     WHAT IS A BLOOD TRANSFUSION? Blood Transfusion Information  A transfusion is the replacement of blood or some of its parts. Blood is made up of multiple cells which provide different functions. Red blood cells carry oxygen and are used for blood loss replacement. White blood cells fight against infection. Platelets control bleeding. Plasma helps clot blood. Other blood products are available for specialized needs, such as hemophilia or other  clotting disorders. BEFORE THE TRANSFUSION  Who gives blood for transfusions?  Healthy volunteers who are fully evaluated to make sure their blood is safe. This is blood bank blood. Transfusion therapy is the safest it has ever been in the practice of medicine. Before blood is taken from a donor, a complete history is taken to make sure that person has no history of diseases nor engages in risky social behavior (examples are intravenous drug use or sexual activity with multiple partners). The donor's travel history is screened to minimize risk of transmitting infections, such as malaria. The donated blood is tested for signs of infectious diseases, such as HIV and hepatitis. The blood is then tested to be sure it is compatible with you in order to minimize the chance of a transfusion reaction. If you or a relative donates blood, this is often done in anticipation of surgery and is not appropriate for emergency situations. It takes many days to process the donated blood. RISKS AND COMPLICATIONS Although transfusion therapy is very safe and saves many lives, the main dangers of transfusion include:  Getting an infectious disease. Developing a transfusion reaction. This is an allergic reaction to something in the blood you were given. Every precaution is taken to prevent this. The decision to have a  blood transfusion has been considered carefully by your caregiver before blood is given. Blood is not given unless the benefits outweigh the risks. AFTER THE TRANSFUSION Right after receiving a blood transfusion, you will usually feel much better and more energetic. This is especially true if your red blood cells have gotten low (anemic). The transfusion raises the level of the red blood cells which carry oxygen, and this usually causes an energy increase. The nurse administering the transfusion will monitor you carefully for complications. HOME CARE INSTRUCTIONS  No special instructions are needed after a  transfusion. You may find your energy is better. Speak with your caregiver about any limitations on activity for underlying diseases you may have. SEEK MEDICAL CARE IF:  Your condition is not improving after your transfusion. You develop redness or irritation at the intravenous (IV) site. SEEK IMMEDIATE MEDICAL CARE IF:  Any of the following symptoms occur over the next 12 hours: Shaking chills. You have a temperature by mouth above 102 F (38.9 C), not controlled by medicine. Chest, back, or muscle pain. People around you feel you are not acting correctly or are confused. Shortness of breath or difficulty breathing. Dizziness and fainting. You get a rash or develop hives. You have a decrease in urine output. Your urine turns a dark color or changes to pink, red, or brown. Any of the following symptoms occur over the next 10 days: You have a temperature by mouth above 102 F (38.9 C), not controlled by medicine. Shortness of breath. Weakness after normal activity. The white part of the eye turns yellow (jaundice). You have a decrease in the amount of urine or are urinating less often. Your urine turns a dark color or changes to pink, red, or brown. Document Released: 11/03/2000 Document Revised: 01/29/2012 Document Reviewed: 06/22/2008 [facility] Patient Information 2014 [place], [place].  _______________________________________________________________________

## 2023-04-05 ENCOUNTER — Ambulatory Visit (HOSPITAL_COMMUNITY)
Admission: RE | Admit: 2023-04-05 | Discharge: 2023-04-05 | Disposition: A | Discharge status: Home / Self Care | Payer: PPO | Source: Ambulatory Visit | Attending: Pain Medicine | Admitting: Pain Medicine

## 2023-04-05 DIAGNOSIS — E785 Hyperlipidemia, unspecified: Secondary | ICD-10-CM | POA: Insufficient documentation

## 2023-04-09 ENCOUNTER — Encounter (HOSPITAL_COMMUNITY): Payer: Self-pay

## 2023-04-09 ENCOUNTER — Encounter (HOSPITAL_COMMUNITY)
Admission: RE | Admit: 2023-04-09 | Discharge: 2023-04-09 | Disposition: A | Discharge status: Home / Self Care | Payer: PPO | Source: Ambulatory Visit | Attending: Orthopaedic Surgery | Admitting: Orthopaedic Surgery

## 2023-04-09 ENCOUNTER — Other Ambulatory Visit: Payer: Self-pay

## 2023-04-09 VITALS — BP 119/60 | HR 55 | Temp 97.7°F | Resp 14 | Ht 67.0 in | Wt 149.0 lb

## 2023-04-09 DIAGNOSIS — I2510 Atherosclerotic heart disease of native coronary artery without angina pectoris: Secondary | ICD-10-CM | POA: Diagnosis not present

## 2023-04-09 DIAGNOSIS — Z87891 Personal history of nicotine dependence: Secondary | ICD-10-CM | POA: Insufficient documentation

## 2023-04-09 DIAGNOSIS — M1611 Unilateral primary osteoarthritis, right hip: Secondary | ICD-10-CM | POA: Diagnosis not present

## 2023-04-09 DIAGNOSIS — Z01818 Encounter for other preprocedural examination: Secondary | ICD-10-CM | POA: Diagnosis not present

## 2023-04-09 DIAGNOSIS — R9431 Abnormal electrocardiogram [ECG] [EKG]: Secondary | ICD-10-CM | POA: Insufficient documentation

## 2023-04-09 LAB — CBC
HCT: 45 % (ref 36.0–46.0)
Hemoglobin: 14.6 g/dL (ref 12.0–15.0)
MCH: 29.2 pg (ref 26.0–34.0)
MCHC: 32.4 g/dL (ref 30.0–36.0)
MCV: 90 fL (ref 80.0–100.0)
Platelets: 246 10*3/uL (ref 150–400)
RBC: 5 MIL/uL (ref 3.87–5.11)
RDW: 12.6 % (ref 11.5–15.5)
WBC: 5.3 10*3/uL (ref 4.0–10.5)
nRBC: 0 % (ref 0.0–0.2)

## 2023-04-09 LAB — COMPREHENSIVE METABOLIC PANEL
ALT: 22 U/L (ref 0–44)
AST: 23 U/L (ref 15–41)
Albumin: 4 g/dL (ref 3.5–5.0)
Alkaline Phosphatase: 77 U/L (ref 38–126)
Anion gap: 8 (ref 5–15)
BUN: 22 mg/dL (ref 8–23)
CO2: 24 mmol/L (ref 22–32)
Calcium: 9.2 mg/dL (ref 8.9–10.3)
Chloride: 106 mmol/L (ref 98–111)
Creatinine, Ser: 0.63 mg/dL (ref 0.44–1.00)
GFR, Estimated: >60 mL/min (ref >60)
Glucose, Bld: 85 mg/dL (ref 70–99)
Potassium: 5.3 mmol/L — ABNORMAL HIGH (ref 3.5–5.1)
Sodium: 138 mmol/L (ref 135–145)
Total Bilirubin: 1.2 mg/dL (ref 0.3–1.2)
Total Protein: 6.8 g/dL (ref 6.5–8.1)

## 2023-04-09 LAB — TYPE AND SCREEN

## 2023-04-09 LAB — SURGICAL PCR SCREEN
MRSA, PCR: NEGATIVE
Staphylococcus aureus: NEGATIVE

## 2023-04-10 ENCOUNTER — Encounter (HOSPITAL_COMMUNITY): Payer: Self-pay

## 2023-04-10 NOTE — Anesthesia Preprocedure Evaluation (Addendum)
Anesthesia Evaluation  Patient identified by MRN, date of birth, ID band Patient awake    Reviewed: Allergy & Precautions, NPO status , Patient's Chart, lab work & pertinent test results  History of Anesthesia Complications (+) PONV  Airway Mallampati: I  TM Distance: >3 FB Neck ROM: Full    Dental  (+) Dental Advisory Given   Pulmonary former smoker   breath sounds clear to auscultation       Cardiovascular + dysrhythmias Supra Ventricular Tachycardia  Rhythm:Regular Rate:Normal     Neuro/Psych negative neurological ROS     GI/Hepatic negative GI ROS, Neg liver ROS,,,  Endo/Other  negative endocrine ROS    Renal/GU negative Renal ROS     Musculoskeletal  (+) Arthritis ,    Abdominal   Peds  Hematology Hb 14.6, plt 246k   Anesthesia Other Findings   Reproductive/Obstetrics                             Anesthesia Physical Anesthesia Plan  ASA: 2  Anesthesia Plan: Spinal   Post-op Pain Management: Tylenol PO (pre-op)*   Induction:   PONV Risk Score and Plan: 3 and Ondansetron, Dexamethasone and Treatment may vary due to age or medical condition  Airway Management Planned: Natural Airway and Simple Face Mask  Additional Equipment: None  Intra-op Plan:   Post-operative Plan:   Informed Consent: I have reviewed the patients History and Physical, chart, labs and discussed the procedure including the risks, benefits and alternatives for the proposed anesthesia with the patient or authorized representative who has indicated his/her understanding and acceptance.     Dental advisory given  Plan Discussed with: CRNA and Surgeon  Anesthesia Plan Comments: (See PAT note from 5/20 by K Jefm Bryant PA-C)        Anesthesia Quick Evaluation

## 2023-04-10 NOTE — Progress Notes (Signed)
Case: [id number] Date/Time: 04/13/23 0700   Procedure: RIGHT TOTAL HIP ARTHROPLASTY ANTERIOR APPROACH (Right: Hip)   Anesthesia type: Spinal   Pre-op diagnosis: OSTEOARTHRITIS / DEGENERATIVE JOINT DISEASE RIGHT HIP   Location[facility] ROOM 09 / [facility]   Surgeons: Kathryne Hitch, MD       DISCUSSION: Lindsey Young is a 72 year old female who presents to PAT prior to surgery above.  Past medical history is significant for former smoking, history of SVT, thrombocytopenia.  Prior anesthesia complications include PONV.  Patient with remote history of SVT, reportedly in 2005-2007.  Was referred to EP for possible ablation but patient was felt not to be a candidate.  She has not had any recurrence.  She is not on any medications for this.  Patient most recently had a CT cardiac scoring of 0 on 04/05/2023.  VS: BP 119/60   Pulse (!) 55   Temp 36.5 C (Oral)   Resp 14   Ht 5\' 7"  (1.702 m)   Wt 67.6 kg   SpO2 100%   BMI 23.34 kg/m   PROVIDERS: Gweneth Dimitri, MD   LABS: Labs reviewed: Acceptable for surgery. (all labs ordered are listed, but only abnormal results are displayed)  Labs Reviewed  COMPREHENSIVE METABOLIC PANEL - Abnormal; Notable for the following components:      Result Value   Potassium 5.3 (*)    All other components within normal limits  SURGICAL PCR SCREEN  CBC  TYPE AND SCREEN     IMAGES:  CT Cardiac Scoring 04/15/23:  IMPRESSION: Coronary calcium score of 0. This was 0 percentile for age-, race-, and sex-matched controls.   EKG 04/09/2023:  Normal sinus rhythm Nonspecific T wave abnormality Rate: 60 bpm  CV: n/a  Past Medical History:  Diagnosis Date   Arthritis    bilateral hips   Dysrhythmia    SVT (all hormone induced 7-8 years ago)   Falls frequently    pt states has had 10 broken bones in life time   H/O cardiovascular stress test    seen by Dr. Mendel Ryder for SVT- (2007 approx.), then referred to Dr. Graciela Husbands, , told not a candidate  for ablation.    H/O thyroid cyst    Osteopenia    PONV (postoperative nausea and vomiting)    Thrombocytopenia (HCC) 10/12/2014    Past Surgical History:  Procedure Laterality Date   APPENDECTOMY     COLONOSCOPY     DILATION AND CURETTAGE OF UTERUS     secondary to polyps   EYE SURGERY     bilat cataract surgery    foot pinned     left  ORIF   FRACTURE SURGERY     trimalleallor fracture, foot & ankle fixation    ORIF PATELLA Left 08/21/2013   Procedure: OPEN REDUCTION INTERNAL (ORIF) OF A LEFT PATELLA FRACTURE/POSSIBLE LEFT PARTIAL PATELLALECTOMY;  Surgeon: Senaida Lange, MD;  Location: [facility];  Service: Orthopedics;  Laterality: Left;   TOTAL HIP ARTHROPLASTY Left 06/18/2015   Procedure: LEFT TOTAL HIP ARTHROPLASTY ANTERIOR APPROACH;  Surgeon: Kathryne Hitch, MD;  Location: [facility];  Service: Orthopedics;  Laterality: Left;    MEDICATIONS:  acetaminophen (TYLENOL) 500 MG tablet   chlorpheniramine (CHLOR-TRIMETON) 4 MG tablet   Cholecalciferol (VITAMIN D3) 50 MCG (2000 UT) capsule   Multiple Vitamin (MULTIVITAMIN WITH MINERALS) TABS tablet   No current facility-administered medications for this encounter.   Marcille Blanco [facility] Short Stay/Anesthesiology [facility] Phone [phone] 04/10/2023  10:02 AM

## 2023-04-12 ENCOUNTER — Telehealth: Payer: Self-pay | Admitting: *Deleted

## 2023-04-12 NOTE — Telephone Encounter (Signed)
Attempted Ortho bundle pre-op call to patient; no answer and left VM requesting call back. 

## 2023-04-12 NOTE — Telephone Encounter (Signed)
Ortho bundle Pre-op call completed. 

## 2023-04-12 NOTE — H&P (Signed)
TOTAL HIP ADMISSION H&P  Patient is admitted for right total hip arthroplasty.  Subjective:  Chief Complaint: right hip pain  HPI: Lindsey Young, 72 y.o. female, has a history of pain and functional disability in the right hip(s) due to arthritis and patient has failed non-surgical conservative treatments for greater than 12 weeks to include NSAID's and/or analgesics, use of assistive devices, and activity modification.  Onset of symptoms was gradual starting 2 years ago with gradually worsening course since that time.The patient noted no past surgery on the right hip(s).  Patient currently rates pain in the right hip at 10 out of 10 with activity. Patient has night pain, worsening of pain with activity and weight bearing, pain that interfers with activities of daily living, and pain with passive range of motion. Patient has evidence of subchondral sclerosis, periarticular osteophytes, and joint space narrowing by imaging studies. This condition presents safety issues increasing the risk of falls.  There is no current active infection.  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 Mendel Ryder for SVT- (2007 approx.), then referred to Dr. Graciela Husbands, , told not a candidate for ablation.    H/O thyroid cyst    Osteopenia    PONV  (postoperative nausea and vomiting)    Thrombocytopenia (HCC) 10/12/2014    Past Surgical History:  Procedure Laterality Date   APPENDECTOMY     COLONOSCOPY     DILATION AND CURETTAGE OF UTERUS     secondary to polyps   EYE SURGERY     bilat cataract surgery    foot pinned     left  ORIF   FRACTURE SURGERY     trimalleallor fracture, foot & ankle fixation    ORIF PATELLA Left 08/21/2013   Procedure: OPEN REDUCTION INTERNAL (ORIF) OF A LEFT PATELLA FRACTURE/POSSIBLE LEFT PARTIAL PATELLALECTOMY;  Surgeon: Senaida Lange, MD;  Location: [facility] OR;  Service: Orthopedics;  Laterality: Left;   TOTAL HIP ARTHROPLASTY Left 06/18/2015   Procedure: LEFT TOTAL HIP ARTHROPLASTY ANTERIOR APPROACH;  Surgeon: Kathryne Hitch, MD;  Location: [facility] ORS;  Service: Orthopedics;  Laterality: Left;    No current facility-administered medications for this encounter.   Current Outpatient Medications  Medication Sig Dispense Refill Last Dose   acetaminophen (TYLENOL) 500 MG tablet Take 1,000 mg by mouth every 6 (six) hours as needed for moderate pain.      chlorpheniramine (CHLOR-TRIMETON) 4 MG tablet Take 4 mg by mouth at bedtime as needed for allergies.      Cholecalciferol (VITAMIN D3) 50 MCG (2000 UT) capsule Take 2,000 Units by mouth daily.      Multiple Vitamin (MULTIVITAMIN WITH MINERALS) TABS tablet Take 1 tablet by mouth daily.      Allergies  Allergen Reactions  Codeine Nausea And Vomiting    Make pain WORSE    Social History   Tobacco Use   Smoking status: Former    Packs/day: 0.25    Years: 20.00    Additional pack years: 0.00    Total pack years: 5.00    Types: Cigarettes    Quit date: 11/20/1993    Years since quitting: 29.4   Smokeless tobacco: Never  Substance Use Topics   Alcohol use: Yes    Alcohol/week: 7.0 standard drinks of alcohol    Types: 7 Glasses of wine per week    Comment: 7 glasses / week    Family History  Problem Relation Age of Onset   Cancer Father         liver cancer, DVT   Cancer Brother        DVT and PE     Review of Systems  Objective:  Physical Exam Vitals reviewed.  Constitutional:      Appearance: Normal appearance.  HENT:     Head: Normocephalic and atraumatic.  Eyes:     Extraocular Movements: Extraocular movements intact.     Pupils: Pupils are equal, round, and reactive to light.  Cardiovascular:     Rate and Rhythm: Normal rate and regular rhythm.     Pulses: Normal pulses.  Pulmonary:     Effort: Pulmonary effort is normal.     Breath sounds: Normal breath sounds.  Abdominal:     Palpations: Abdomen is soft.  Musculoskeletal:     Cervical back: Normal range of motion and neck supple.     Right hip: Tenderness and bony tenderness present. Decreased range of motion. Decreased strength.  Neurological:     Mental Status: She is alert and oriented to person, place, and time.  Psychiatric:        Behavior: Behavior normal.     Vital signs in last 24 hours:    Labs:   Estimated body mass index is 23.34 kg/m as calculated from the following:   Height as of 04/09/23: 5\' 7"  (1.702 m).   Weight as of 04/09/23: 67.6 kg.   Imaging Review Plain radiographs demonstrate severe degenerative joint disease of the right hip(s). The bone quality appears to be good for age and reported activity level.      Assessment/Plan:  End stage arthritis, right hip(s)  The patient history, physical examination, clinical judgement of the provider and imaging studies are consistent with end stage degenerative joint disease of the right hip(s) and total hip arthroplasty is deemed medically necessary. The treatment options including medical management, injection therapy, arthroscopy and arthroplasty were discussed at length. The risks and benefits of total hip arthroplasty were presented and reviewed. The risks due to aseptic loosening, infection, stiffness, dislocation/subluxation,  thromboembolic complications and other  imponderables were discussed.  The patient acknowledged the explanation, agreed to proceed with the plan and consent was signed. Patient is being admitted for inpatient treatment for surgery, pain control, PT, OT, prophylactic antibiotics, VTE prophylaxis, progressive ambulation and ADL's and discharge planning.The patient is planning to be discharged home with home health services

## 2023-04-12 NOTE — Care Plan (Signed)
[facility] RNCM call to patient to discuss her upcoming Right total hip arthroplasty with Dr. Magnus Ivan on 04/13/23. She is an Ortho bundle patient through [facility] and is agreeable to case management. She lives with her spouse, who can assist at discharge. Anticipate HHPT will be needed after short hospital stay. Referral made to [facility] after choice provided. Reviewed post op care instructions. Will continue to follow for needs.

## 2023-04-13 ENCOUNTER — Observation Stay (HOSPITAL_COMMUNITY)
Admission: RE | Admit: 2023-04-13 | Discharge: 2023-04-14 | Disposition: A | Discharge status: Home Health | Payer: PPO | Source: Ambulatory Visit | Attending: Orthopaedic Surgery | Admitting: Orthopaedic Surgery

## 2023-04-13 ENCOUNTER — Ambulatory Visit (HOSPITAL_BASED_OUTPATIENT_CLINIC_OR_DEPARTMENT_OTHER): Payer: PPO | Admitting: Anesthesiology

## 2023-04-13 ENCOUNTER — Encounter (HOSPITAL_COMMUNITY)
Admission: RE | Disposition: A | Discharge status: Home Health | Payer: Self-pay | Source: Ambulatory Visit | Attending: Orthopaedic Surgery

## 2023-04-13 ENCOUNTER — Ambulatory Visit (HOSPITAL_COMMUNITY): Payer: PPO | Admitting: Medical

## 2023-04-13 ENCOUNTER — Ambulatory Visit (HOSPITAL_COMMUNITY): Payer: PPO

## 2023-04-13 ENCOUNTER — Other Ambulatory Visit: Payer: Self-pay

## 2023-04-13 ENCOUNTER — Encounter (HOSPITAL_COMMUNITY): Payer: Self-pay | Admitting: Orthopaedic Surgery

## 2023-04-13 DIAGNOSIS — Z96641 Presence of right artificial hip joint: Secondary | ICD-10-CM

## 2023-04-13 DIAGNOSIS — Z79899 Other long term (current) drug therapy: Secondary | ICD-10-CM | POA: Diagnosis not present

## 2023-04-13 DIAGNOSIS — M1611 Unilateral primary osteoarthritis, right hip: Secondary | ICD-10-CM | POA: Diagnosis not present

## 2023-04-13 DIAGNOSIS — Z87891 Personal history of nicotine dependence: Secondary | ICD-10-CM | POA: Diagnosis not present

## 2023-04-13 DIAGNOSIS — I471 Supraventricular tachycardia, unspecified: Secondary | ICD-10-CM

## 2023-04-13 DIAGNOSIS — Z96642 Presence of left artificial hip joint: Secondary | ICD-10-CM | POA: Insufficient documentation

## 2023-04-13 DIAGNOSIS — Z471 Aftercare following joint replacement surgery: Secondary | ICD-10-CM | POA: Diagnosis not present

## 2023-04-13 HISTORY — PX: TOTAL HIP ARTHROPLASTY: SHX124

## 2023-04-13 LAB — TYPE AND SCREEN
ABO/RH(D): A POS
Antibody Screen: NEGATIVE

## 2023-04-13 SURGERY — ARTHROPLASTY, HIP, TOTAL, ANTERIOR APPROACH
Anesthesia: Spinal | Site: Hip | Laterality: Right

## 2023-04-13 MED ORDER — ASPIRIN 81 MG PO CHEW
81.0000 mg | CHEWABLE_TABLET | Freq: Two times a day (BID) | ORAL | Status: DC
  Administered 2023-04-13 – 2023-04-14 (×2): 81 mg via ORAL
  Filled 2023-04-13 (×2): qty 1

## 2023-04-13 MED ORDER — MEPERIDINE HCL 50 MG/ML IJ SOLN: 6.2500 mg | INTRAMUSCULAR | Status: DC | PRN

## 2023-04-13 MED ORDER — TRANEXAMIC ACID-NACL 1000-0.7 MG/100ML-% IV SOLN
1000.0000 mg | INTRAVENOUS | Status: AC
  Administered 2023-04-13: 1000 mg via INTRAVENOUS
  Filled 2023-04-13: qty 100

## 2023-04-13 MED ORDER — PANTOPRAZOLE SODIUM 40 MG PO TBEC
40.0000 mg | DELAYED_RELEASE_TABLET | Freq: Every day | ORAL | Status: DC
  Administered 2023-04-14: 40 mg via ORAL
  Filled 2023-04-13: qty 1

## 2023-04-13 MED ORDER — CHLORHEXIDINE GLUCONATE 0.12 % MT SOLN
15.0000 mL | Freq: Once | OROMUCOSAL | Status: AC
  Administered 2023-04-13: 15 mL via OROMUCOSAL

## 2023-04-13 MED ORDER — SODIUM CHLORIDE 0.9 % IR SOLN
Status: DC | PRN
  Administered 2023-04-13: 1000 mL

## 2023-04-13 MED ORDER — VITAMIN D3 25 MCG (1000 UNIT) PO TABS
2000.0000 [IU] | ORAL_TABLET | Freq: Every day | ORAL | Status: DC
  Administered 2023-04-14: 2000 [IU] via ORAL
  Filled 2023-04-13 (×2): qty 2

## 2023-04-13 MED ORDER — HYDROMORPHONE HCL 1 MG/ML IJ SOLN
0.5000 mg | INTRAMUSCULAR | Status: DC | PRN
  Administered 2023-04-13: 1 mg via INTRAVENOUS
  Filled 2023-04-13: qty 1

## 2023-04-13 MED ORDER — OXYCODONE HCL 5 MG PO TABS
5.0000 mg | ORAL_TABLET | ORAL | Status: DC | PRN
  Administered 2023-04-13 (×2): 10 mg via ORAL
  Filled 2023-04-13 (×3): qty 2

## 2023-04-13 MED ORDER — POLYETHYLENE GLYCOL 3350 17 G PO PACK: 17.0000 g | PACK | Freq: Every day | ORAL | Status: DC | PRN

## 2023-04-13 MED ORDER — ONDANSETRON HCL 4 MG/2ML IJ SOLN
INTRAMUSCULAR | Status: AC
  Filled 2023-04-13: qty 2

## 2023-04-13 MED ORDER — HYDROMORPHONE HCL 1 MG/ML IJ SOLN: 0.2500 mg | INTRAMUSCULAR | Status: DC | PRN

## 2023-04-13 MED ORDER — DEXAMETHASONE SODIUM PHOSPHATE 10 MG/ML IJ SOLN
INTRAMUSCULAR | Status: DC | PRN
  Administered 2023-04-13: 10 mg via INTRAVENOUS

## 2023-04-13 MED ORDER — OXYCODONE HCL 5 MG PO TABS: 10.0000 mg | ORAL_TABLET | ORAL | Status: DC | PRN

## 2023-04-13 MED ORDER — PROPOFOL 500 MG/50ML IV EMUL
INTRAVENOUS | Status: DC | PRN
  Administered 2023-04-13: 75 ug/kg/min via INTRAVENOUS

## 2023-04-13 MED ORDER — METOCLOPRAMIDE HCL 5 MG PO TABS: 5.0000 mg | ORAL_TABLET | Freq: Three times a day (TID) | ORAL | Status: DC | PRN

## 2023-04-13 MED ORDER — ONDANSETRON HCL 4 MG/2ML IJ SOLN
INTRAMUSCULAR | Status: DC | PRN
  Administered 2023-04-13: 4 mg via INTRAVENOUS

## 2023-04-13 MED ORDER — ACETAMINOPHEN 500 MG PO TABS
1000.0000 mg | ORAL_TABLET | Freq: Once | ORAL | Status: AC
  Administered 2023-04-13: 1000 mg via ORAL
  Filled 2023-04-13: qty 2

## 2023-04-13 MED ORDER — GLYCOPYRROLATE 0.2 MG/ML IJ SOLN
INTRAMUSCULAR | Status: AC
  Filled 2023-04-13: qty 1

## 2023-04-13 MED ORDER — ACETAMINOPHEN 325 MG PO TABS
325.0000 mg | ORAL_TABLET | Freq: Four times a day (QID) | ORAL | Status: DC | PRN
  Administered 2023-04-14: 650 mg via ORAL
  Filled 2023-04-13: qty 2

## 2023-04-13 MED ORDER — ONDANSETRON HCL 4 MG PO TABS: 4.0000 mg | ORAL_TABLET | Freq: Four times a day (QID) | ORAL | Status: DC | PRN

## 2023-04-13 MED ORDER — OXYCODONE HCL 5 MG/5ML PO SOLN: 5.0000 mg | Freq: Once | ORAL | Status: DC | PRN

## 2023-04-13 MED ORDER — ONDANSETRON HCL 4 MG/2ML IJ SOLN
4.0000 mg | Freq: Four times a day (QID) | INTRAMUSCULAR | Status: DC | PRN
  Administered 2023-04-13: 4 mg via INTRAVENOUS
  Filled 2023-04-13: qty 2

## 2023-04-13 MED ORDER — BUPIVACAINE IN DEXTROSE 0.75-8.25 % IT SOLN
INTRATHECAL | Status: DC | PRN
  Administered 2023-04-13: 12 mg via INTRATHECAL

## 2023-04-13 MED ORDER — POVIDONE-IODINE 10 % EX SWAB: 2.0000 | Freq: Once | CUTANEOUS | Status: DC

## 2023-04-13 MED ORDER — METHOCARBAMOL 1000 MG/10ML IJ SOLN
500.0000 mg | Freq: Four times a day (QID) | INTRAVENOUS | Status: DC | PRN
  Filled 2023-04-13: qty 5

## 2023-04-13 MED ORDER — METHOCARBAMOL 500 MG PO TABS
500.0000 mg | ORAL_TABLET | Freq: Four times a day (QID) | ORAL | Status: DC | PRN
  Administered 2023-04-13: 500 mg via ORAL
  Filled 2023-04-13: qty 1

## 2023-04-13 MED ORDER — MIDAZOLAM HCL 2 MG/2ML IJ SOLN: 0.5000 mg | Freq: Once | INTRAMUSCULAR | Status: DC | PRN

## 2023-04-13 MED ORDER — PROMETHAZINE HCL 25 MG/ML IJ SOLN: 6.2500 mg | INTRAMUSCULAR | Status: DC | PRN

## 2023-04-13 MED ORDER — ORAL CARE MOUTH RINSE: 15.0000 mL | Freq: Once | OROMUCOSAL | Status: AC

## 2023-04-13 MED ORDER — LACTATED RINGERS IV SOLN: INTRAVENOUS | Status: DC

## 2023-04-13 MED ORDER — 0.9 % SODIUM CHLORIDE (POUR BTL) OPTIME
TOPICAL | Status: DC | PRN
  Administered 2023-04-13: 1000 mL

## 2023-04-13 MED ORDER — ALUM & MAG HYDROXIDE-SIMETH 200-200-20 MG/5ML PO SUSP: 30.0000 mL | ORAL | Status: DC | PRN

## 2023-04-13 MED ORDER — CEFAZOLIN SODIUM-DEXTROSE 2-4 GM/100ML-% IV SOLN
2.0000 g | INTRAVENOUS | Status: AC
  Administered 2023-04-13: 2 g via INTRAVENOUS
  Filled 2023-04-13: qty 100

## 2023-04-13 MED ORDER — PROPOFOL 1000 MG/100ML IV EMUL
INTRAVENOUS | Status: AC
  Filled 2023-04-13: qty 100

## 2023-04-13 MED ORDER — DOCUSATE SODIUM 100 MG PO CAPS
100.0000 mg | ORAL_CAPSULE | Freq: Two times a day (BID) | ORAL | Status: DC
  Administered 2023-04-13 – 2023-04-14 (×2): 100 mg via ORAL
  Filled 2023-04-13 (×2): qty 1

## 2023-04-13 MED ORDER — PHENOL 1.4 % MT LIQD: 1.0000 | OROMUCOSAL | Status: DC | PRN

## 2023-04-13 MED ORDER — DEXAMETHASONE SODIUM PHOSPHATE 10 MG/ML IJ SOLN
INTRAMUSCULAR | Status: AC
  Filled 2023-04-13: qty 1

## 2023-04-13 MED ORDER — PROPOFOL 10 MG/ML IV BOLUS
INTRAVENOUS | Status: DC | PRN
  Administered 2023-04-13 (×3): 10 mg via INTRAVENOUS
  Administered 2023-04-13: 20 mg via INTRAVENOUS

## 2023-04-13 MED ORDER — CEFAZOLIN SODIUM-DEXTROSE 1-4 GM/50ML-% IV SOLN
1.0000 g | Freq: Four times a day (QID) | INTRAVENOUS | Status: AC
  Administered 2023-04-13 (×2): 1 g via INTRAVENOUS
  Filled 2023-04-13 (×2): qty 50

## 2023-04-13 MED ORDER — PHENYLEPHRINE HCL-NACL 20-0.9 MG/250ML-% IV SOLN
INTRAVENOUS | Status: DC | PRN
  Administered 2023-04-13: 35 ug/min via INTRAVENOUS

## 2023-04-13 MED ORDER — DIPHENHYDRAMINE HCL 12.5 MG/5ML PO ELIX
12.5000 mg | ORAL_SOLUTION | ORAL | Status: DC | PRN
  Administered 2023-04-14: 25 mg via ORAL
  Filled 2023-04-13: qty 10

## 2023-04-13 MED ORDER — METOCLOPRAMIDE HCL 5 MG/ML IJ SOLN: 5.0000 mg | Freq: Three times a day (TID) | INTRAMUSCULAR | Status: DC | PRN

## 2023-04-13 MED ORDER — MENTHOL 3 MG MT LOZG: 1.0000 | LOZENGE | OROMUCOSAL | Status: DC | PRN

## 2023-04-13 MED ORDER — OXYCODONE HCL 5 MG PO TABS: 5.0000 mg | ORAL_TABLET | Freq: Once | ORAL | Status: DC | PRN

## 2023-04-13 MED ORDER — SODIUM CHLORIDE 0.9 % IV SOLN: INTRAVENOUS | Status: DC

## 2023-04-13 SURGICAL SUPPLY — 44 items
APL SKNCLS STERI-STRIP NONHPOA (GAUZE/BANDAGES/DRESSINGS)
BAG COUNTER SPONGE SURGICOUNT (BAG) ×2 IMPLANT
BAG SPEC THK2 15X12 ZIP CLS (MISCELLANEOUS)
BAG SPNG CNTER NS LX DISP (BAG) ×1
BAG ZIPLOCK 12X15 (MISCELLANEOUS) IMPLANT
BENZOIN TINCTURE PRP APPL 2/3 (GAUZE/BANDAGES/DRESSINGS) IMPLANT
BLADE SAW SGTL 18X1.27X75 (BLADE) ×2 IMPLANT
COVER PERINEAL POST (MISCELLANEOUS) ×2 IMPLANT
COVER SURGICAL LIGHT HANDLE (MISCELLANEOUS) ×2 IMPLANT
DRAPE FOOT SWITCH (DRAPES) ×2 IMPLANT
DRAPE STERI IOBAN 125X83 (DRAPES) ×2 IMPLANT
DRAPE U-SHAPE 47X51 STRL (DRAPES) ×4 IMPLANT
DRESSING AQUACEL AG SP 3.5X10 (GAUZE/BANDAGES/DRESSINGS) IMPLANT
DRSG AQUACEL AG ADV 3.5X10 (GAUZE/BANDAGES/DRESSINGS) ×2 IMPLANT
DRSG AQUACEL AG SP 3.5X10 (GAUZE/BANDAGES/DRESSINGS) ×1
DURAPREP 26ML APPLICATOR (WOUND CARE) ×2 IMPLANT
ELECT REM PT RETURN 15FT ADLT (MISCELLANEOUS) ×2 IMPLANT
GAUZE XEROFORM 1X8 LF (GAUZE/BANDAGES/DRESSINGS) ×2 IMPLANT
GLOVE BIO SURGEON STRL SZ7.5 (GLOVE) ×2 IMPLANT
GLOVE BIOGEL PI IND STRL 8 (GLOVE) ×4 IMPLANT
GLOVE ECLIPSE 8.0 STRL XLNG CF (GLOVE) ×2 IMPLANT
GOWN STRL REUS W/ TWL XL LVL3 (GOWN DISPOSABLE) ×4 IMPLANT
GOWN STRL REUS W/TWL XL LVL3 (GOWN DISPOSABLE) ×2
HANDPIECE INTERPULSE COAX TIP (DISPOSABLE) ×1
HEAD M SROM 36MM 2 (Hips) IMPLANT
HOLDER FOLEY CATH W/STRAP (MISCELLANEOUS) ×2 IMPLANT
KIT TURNOVER KIT A (KITS) IMPLANT
LINER NEUTRAL 52X36MM PLUS 4 (Liner) IMPLANT
PACK ANTERIOR HIP CUSTOM (KITS) ×2 IMPLANT
PIN SECTOR W/GRIP ACE CUP 52MM (Hips) IMPLANT
SET HNDPC FAN SPRY TIP SCT (DISPOSABLE) ×2 IMPLANT
SROM M HEAD 36MM 2 (Hips) ×1 IMPLANT
STAPLER VISISTAT 35W (STAPLE) IMPLANT
STEM CORAIL KA13 (Stem) IMPLANT
STRIP CLOSURE SKIN 1/2X4 (GAUZE/BANDAGES/DRESSINGS) IMPLANT
SUT ETHIBOND NAB CT1 #1 30IN (SUTURE) ×2 IMPLANT
SUT ETHILON 2 0 PS N (SUTURE) IMPLANT
SUT MNCRL AB 4-0 PS2 18 (SUTURE) IMPLANT
SUT VIC AB 0 CT1 36 (SUTURE) ×2 IMPLANT
SUT VIC AB 1 CT1 36 (SUTURE) ×2 IMPLANT
SUT VIC AB 2-0 CT1 27 (SUTURE) ×2
SUT VIC AB 2-0 CT1 TAPERPNT 27 (SUTURE) ×4 IMPLANT
TRAY FOLEY MTR SLVR 16FR STAT (SET/KITS/TRAYS/PACK) IMPLANT
YANKAUER SUCT BULB TIP NO VENT (SUCTIONS) ×2 IMPLANT

## 2023-04-13 NOTE — Interval H&P Note (Signed)
History and Physical Interval Note: The patient understands that she is here today for a right total hip replacement to treat her severe right hip arthritis.  There has been no acute or interval change in her medical status.  The risks and benefits of surgery been discussed in detail and informed consent has been obtained.  The right operative hip has been marked.  04/13/2023 7:12 AM  Lindsey Young  has presented today for surgery, with the diagnosis of OSTEOARTHRITIS / DEGENERATIVE JOINT DISEASE RIGHT HIP.  The various methods of treatment have been discussed with the patient and family. After consideration of risks, benefits and other options for treatment, the patient has consented to  Procedure(s): RIGHT TOTAL HIP ARTHROPLASTY ANTERIOR APPROACH (Right) as a surgical intervention.  The patient's history has been reviewed, patient examined, no change in status, stable for surgery.  I have reviewed the patient's chart and labs.  Questions were answered to the patient's satisfaction.     Kathryne Hitch

## 2023-04-13 NOTE — Anesthesia Procedure Notes (Signed)
Procedure Name: MAC Date/Time: 04/13/2023 7:11 AM  Performed by: Orest Dikes, CRNAPre-anesthesia Checklist: Patient identified, Emergency Drugs available, Suction available and Patient being monitored Oxygen Delivery Method: Simple face mask

## 2023-04-13 NOTE — Op Note (Signed)
Operative Note  Date of operation: 03/13/2022 Preoperative diagnosis: Right hip primary osteoarthritis Postoperative diagnosis: Same  Procedure: Right direct anterior total hip arthroplasty  Implants: Implant Name Type Inv. Item Serial No. Manufacturer Lot No. LRB No. Used Action  PIN SECTOR W/GRIP ACE CUP - [id number] Hips PIN SECTOR W/GRIP ACE CUP  [employer]PU[employer] [id number] Right 1 Implanted  LINER NEUTRAL 52X36MM PLUS 4 - [id number] Liner LINER NEUTRAL [id number] PLUS 4  DEPUY ORTHOPAEDICS [id number] Right 1 Implanted  SROM M HEAD 2 - [id number] Hips SROM M HEAD 2  DEPUY ORTHOPAEDICS [id number] Right 1 Implanted  STEM Welford Roche - [id number] Stem STEM Michae Kava [employer] [id number] Right 1 Implanted   Surgeon: Vanita Panda. Magnus Ivan, MD Assistant: Rexene Edison, PA-C  Anesthesia: Spinal Antibiotics: IV Ancef EBL: 100 cc Complications: None  Indications: The patient is a 72 year old female with debilitating arthritis involving her right hip.  We actually replaced her left hip remotely.  That is done very well.  Her right hip pain has been slowly getting worse.  At this point it is detrimentally affecting her mobility, her quality of life and actives daily living.  She does wish to proceed with a hip replaced on the right side.  Having this before on the left side she is fully aware of the risk of acute blood loss anemia, nerve vessel injury, fracture, infection, DVT, dislocation, implant failure, leg length differences and wound healing issues.  She understands that her goals are hopefully decrease pain, improve mobility and improve quality of life.  Procedure description: After informed consent was obtained and the appropriate right hip was marked, the patient was brought to the operating room and set up on the stretcher where spinal anesthesia was obtained.  She was then laid in supine position on stretcher and a Foley catheter was placed.  Traction boots were  placed on both her feet and she was placed supine on the Hana fracture table with a perineal post in place in both legs and inline skeletal traction devices no traction applied.  Her right operative hip was assessed radiographically.  He was then prepped and draped with DuraPrep and sterile drapes.  A timeout was called and she was notified is correct patient correct right hip.  An incision was then made just inferior and posterior to the ASIS and carried slightly bleak down the leg.  Dissection was carried down to the tensor fascia lata muscle and the tensor fascia was then divided longitudinally to proceed with very tender approach to the hip.  Circumflex vessels were identified and cauterized.  The hip capsule was identified and opened up in L-type format finding a moderate joint effusion.  Cobra retractors were placed around the medial and lateral femoral neck and a femoral neck cut was made with an oscillating saw just proximal to the lesser trochanter and this was completed with an osteotome.  A corkscrew guide was placed in the femoral head and the femoral head was removed its entirety and there was a wide area devoid of cartilage.  A bent Hohmann was then placed over the medial acetabular rim and remnants of the acetabular labrum and other debris removed.  Reaming was then initiated from a size 43 reamer and stepwise increments going up to a size 51 reamer with all reamers placed under direct visualization and the last reamer was placed under direct fluoroscopy in order to obtain the depth of reaming, the inclination and the anteversion.  The real DePuy  sector GRIPTION acetabular opponent size 52 was then placed without difficulty.  A 36+4 neutral polythene liner was placed within that sharp component based on medialization of the cup and her anatomy.  Attention was then turned to the femur.  With the leg externally rotated to 120 degrees, extended and adducted, a Mueller retractor was placed medially and a  Hohmann retractor was placed behind the greater trochanter.  The lateral joint capsule was released and a box cutting osteotome was used in the femoral canal.  Broaching was then initiated from a size 8 broach going and stepwise increments up to a size 13 broach.  With a size 13 broach in place we trialed a standard offset femoral neck and a 36-2 trial hip ball.  The leg was brought over and up and with traction and internal rotation was reduced in the pelvis.  We are pleased with leg length, offset, range of motion and stability assessed both mechanically and radiographically.  The hip was then dislocated and all trial components were removed.  The real Corail femoral component with standard offset size 13 was placed followed by a 36-2 metal head ball.  Again this was reduced in the acetabulum and we are very pleased with her stability and assessment radiographically and clinically.  The soft tissue was then irrigated with normal saline solution.  The joint capsule was closed with interrupted #1 Ethibond suture followed by #1 Vicryl close the tensor fascia.  0 Vicryl was used to close the deep tissue and 2-0 Vicryl was used to close the subcutaneous tissue.  The skin was closed with staples.  An Aquacel dressing was applied.  The patient was taken off the Hana table and taken recovery room in stable addition with all final counts being correct and no complications noted.  Rexene Edison, PA-C did assist during the entire case and beginning to end and his assistance was medically necessary and crucial for soft tissue management and retraction, helping guide implant placement and a layered closure of the wound.

## 2023-04-13 NOTE — Anesthesia Postprocedure Evaluation (Signed)
Anesthesia Post Note  Patient: Lindsey Young  Procedure(s) Performed: RIGHT TOTAL HIP ARTHROPLASTY ANTERIOR APPROACH (Right: Hip)     Patient location during evaluation: PACU Anesthesia Type: Spinal Level of consciousness: oriented, awake and alert and patient cooperative Pain management: pain level controlled Vital Signs Assessment: post-procedure vital signs reviewed and stable Respiratory status: spontaneous breathing, respiratory function stable and nonlabored ventilation Cardiovascular status: blood pressure returned to baseline and stable Postop Assessment: no headache, no backache, no apparent nausea or vomiting, spinal receding and patient able to bend at knees Anesthetic complications: no   No notable events documented.  Last Vitals:  Vitals:   04/13/23 0845 04/13/23 0900  BP: 105/66 103/61  Pulse: (!) 53 (!) 46  Resp: 16 14  Temp:    SpO2: 100% 100%    Last Pain:  Vitals:   04/13/23 0900  TempSrc:   PainSc: 0-No pain                 Taylon Coole,E. Sheza Strickland

## 2023-04-13 NOTE — Anesthesia Procedure Notes (Signed)
Spinal  Patient location during procedure: OR End time: 04/13/2023 7:16 AM Reason for block: surgical anesthesia Staffing Performed: anesthesiologist  Anesthesiologist: Jairo Ben, MD Performed by: Jairo Ben, MD Authorized by: Jairo Ben, MD   Preanesthetic Checklist Completed: patient identified, IV checked, site marked, risks and benefits discussed, surgical consent, monitors and equipment checked, pre-op evaluation and timeout performed Spinal Block Patient position: sitting Prep: DuraPrep and site prepped and draped Patient monitoring: heart rate, cardiac monitor, continuous pulse ox and blood pressure Approach: midline Location: L3-4 Injection technique: single-shot Needle Needle type: Pencan and Introducer  Needle gauge: 24 G Needle length: 9 cm Assessment Sensory level: T4 Events: CSF return Additional Notes IPt identified in Operating room.  Monitors applied. Working IV access confirmed. Sterile prep, drape lumbar spine.  1% lido local L 3,4.  #24ga Pencan into clear CSF L 3,4.  12mg  0.75% Bupivacaine with dextrose injected with asp CSF beginning and end of injection.  Patient asymptomatic, VSS, no heme aspirated, tolerated well.  Sandford Craze, MD

## 2023-04-13 NOTE — TOC Transition Note (Signed)
Transition of Care [facility]) - CM/SW Discharge Note   Patient Details  Name: Lindsey Young MRN: [id number] Date of Birth: [birth year]  Transition of Care [facility]) CM/SW Contact:  Amada Jupiter, LCSW Phone Number: 04/13/2023, 2:26 PM   Clinical Narrative:     Met with pt and confirming she has needed DME at home.  HHPT prearranged with [facility].  No further TOC needs.  Final next level of care: Home w Home Health Services Barriers to Discharge: No Barriers Identified   Patient Goals and CMS Choice      Discharge Placement                         Discharge Plan and Services Additional resources added to the After Visit Summary for                  DME Arranged: N/A DME Agency: NA       HH Arranged: PT HH Agency: [facility]        Social Determinants of Health (SDOH) Interventions SDOH Screenings   Food Insecurity: No Food Insecurity (04/13/2023)  Housing: Low Risk  (04/13/2023)  Transportation Needs: No Transportation Needs (04/13/2023)  Utilities: Not At Risk (04/13/2023)  Tobacco Use: Medium Risk (04/13/2023)     Readmission Risk Interventions     No data to display

## 2023-04-13 NOTE — Evaluation (Signed)
Physical Therapy Evaluation Patient Details Name: Lindsey Young MRN: [id number] DOB: 11/20/1951 Today's Date: 04/13/2023  History of Present Illness  72 yo female presents to therapy s/p R THA, anterior approach on 04/13/2023 due to failure of conservative measures. Pt PMH includes but is not limited to: L THA, thrombocytopenia, OA, R ankle fx s/p ORIF and falls.  Clinical Impression    Lindsey Young is a 72 y.o. female POD 0 s/p L THA. Patient reports IND with mobility at baseline. Patient is now limited by functional impairments (see PT problem list below) and requires min guard for bed mobility and min guard and cues for transfers. Patient was able to ambulate 90 feet with RW and min guard level of assist. Patient instructed in exercise to facilitate ROM and circulation to manage edema. Patient will benefit from continued skilled PT interventions to address impairments and progress towards PLOF. Acute PT will follow to progress mobility and stair training in preparation for safe discharge home with family support and [facility] services.      Recommendations for follow up therapy are one component of a multi-disciplinary discharge planning process, led by the attending physician.  Recommendations may be updated based on patient status, additional functional criteria and insurance authorization.  Follow Up Recommendations       Assistance Recommended at Discharge Intermittent Supervision/Assistance  Patient can return home with the following  A little help with walking and/or transfers;A little help with bathing/dressing/bathroom;Assistance with cooking/housework;Assist for transportation;Help with stairs or ramp for entrance    Equipment Recommendations None recommended by PT (Pt reports DME in home setting)  Recommendations for Other Services       Functional Status Assessment Patient has had a recent decline in their functional status and demonstrates the ability to make significant  improvements in function in a reasonable and predictable amount of time.     Precautions / Restrictions Precautions Precautions: Fall Restrictions Weight Bearing Restrictions: No      Mobility  Bed Mobility Overal bed mobility: Needs Assistance Bed Mobility: Supine to Sit     Supine to sit: HOB elevated, Min guard (bed rail)     General bed mobility comments: min cues, pt able to use L LE to assist R to EOB    Transfers Overall transfer level: Needs assistance Equipment used: Rolling walker (2 wheels) Transfers: Sit to/from Stand Sit to Stand: Min guard           General transfer comment: min cues for proper UE and AD placement    Ambulation/Gait Ambulation/Gait assistance: Min guard Gait Distance (Feet): 90 Feet Assistive device: Rolling walker (2 wheels) Gait Pattern/deviations: Step-to pattern, Antalgic Gait velocity: decreased     General Gait Details: pt reported feeling like her R LE was longer than the L and slight lateral sway, step almost through pattern and min cues for extension posture  Stairs            Wheelchair Mobility    Modified Rankin (Stroke Patients Only)       Balance Overall balance assessment: History of Falls, Needs assistance Sitting-balance support: Feet supported Sitting balance-Leahy Scale: Fair     Standing balance support: Bilateral upper extremity supported, During functional activity, Reliant on assistive device for balance Standing balance-Leahy Scale: Poor                               Pertinent Vitals/Pain Pain Assessment Pain Assessment: 0-10 Pain  Score: 8  Pain Location: R hip Pain Descriptors / Indicators: Constant, Discomfort, Operative site guarding, Sore Pain Intervention(s): Limited activity within patient's tolerance, Monitored during session, Premedicated before session, Ice applied (pt reported pain decreased to 4/10 with functional mobility tasks)    Home Living Family/patient  expects to be discharged to:: Private residence Living Arrangements: Spouse/significant other Available Help at Discharge: Family Type of Home: House Home Access: Stairs to enter Entrance Stairs-Rails: None Entrance Stairs-Number of Steps: 1   Home Layout: One level Home Equipment: Agricultural consultant (2 wheels);Crutches (CAM boots)      Prior Function Prior Level of Function : Independent/Modified Independent;Driving             Mobility Comments: IND with ADls, self care tasks, IADLs and driving       Hand Dominance   Dominant Hand: Right    Extremity/Trunk Assessment        Lower Extremity Assessment Lower Extremity Assessment: RLE deficits/detail RLE Deficits / Details: ankle DF/PF 5/5 RLE Sensation: WNL    Cervical / Trunk Assessment Cervical / Trunk Assessment: Normal  Communication   Communication: No difficulties  Cognition Arousal/Alertness: Awake/alert Behavior During Therapy: WFL for tasks assessed/performed Overall Cognitive Status: Within Functional Limits for tasks assessed                                          General Comments      Exercises Total Joint Exercises Ankle Circles/Pumps: AROM, Both, 20 reps   Assessment/Plan    PT Assessment Patient needs continued PT services  PT Problem List Decreased strength;Decreased range of motion;Decreased activity tolerance;Decreased balance;Decreased mobility;Decreased coordination;Pain       PT Treatment Interventions DME instruction;Gait training;Stair training;Functional mobility training;Therapeutic activities;Therapeutic exercise;Balance training;Neuromuscular re-education;Modalities;Patient/family education    PT Goals (Current goals can be found in the Care Plan section)  Acute Rehab PT Goals Patient Stated Goal: to get back to excercise classes, walking 3 miles and be able to sit without pain for longer durations PT Goal Formulation: With patient Time For Goal Achievement:  04/27/23 Potential to Achieve Goals: Good    Frequency 7X/week     Co-evaluation               AM-PAC PT "6 Clicks" Mobility  Outcome Measure Help needed turning from your back to your side while in a flat bed without using bedrails?: None Help needed moving from lying on your back to sitting on the side of a flat bed without using bedrails?: A Little Help needed moving to and from a bed to a chair (including a wheelchair)?: A Little Help needed standing up from a chair using your arms (e.g., wheelchair or bedside chair)?: A Little Help needed to walk in hospital room?: A Little Help needed climbing 3-5 steps with a railing? : A Lot 6 Click Score: 18    End of Session Equipment Utilized During Treatment: Gait belt Activity Tolerance: Patient tolerated treatment well Patient left: in chair;with call bell/phone within reach Nurse Communication: Mobility status;Patient requests pain meds PT Visit Diagnosis: Unsteadiness on feet (R26.81);Other abnormalities of gait and mobility (R26.89);Muscle weakness (generalized) (M62.81);History of falling (Z91.81);Pain Pain - Right/Left: Right Pain - part of body: Hip    Time: 1610-9604 PT Time Calculation (min) (ACUTE ONLY): 28 min   Charges:   PT Evaluation $PT Eval Low Complexity: 1 Low PT Treatments $Gait Training: 8-22  mins        Johnny Bridge, PT Acute Rehab   Jacqualyn Posey 04/13/2023, 3:22 PM

## 2023-04-13 NOTE — Transfer of Care (Signed)
Immediate Anesthesia Transfer of Care Note  Patient: Lindsey Young  Procedure(s) Performed: RIGHT TOTAL HIP ARTHROPLASTY ANTERIOR APPROACH (Right: Hip)  Patient Location: PACU  Anesthesia Type:Spinal  Level of Consciousness: awake, alert , and oriented  Airway & Oxygen Therapy: Patient Spontanous Breathing and Patient connected to face mask oxygen  Post-op Assessment: Report given to RN and Post -op Vital signs reviewed and stable  Post vital signs: Reviewed and stable  Last Vitals:  Vitals Value Taken Time  BP    Temp    Pulse 58 04/13/23 0835  Resp    SpO2 100 % 04/13/23 0835  Vitals shown include unvalidated device data.  Last Pain:  Vitals:   04/13/23 0604  TempSrc:   PainSc: 0-No pain         Complications: No notable events documented.

## 2023-04-14 DIAGNOSIS — M1611 Unilateral primary osteoarthritis, right hip: Secondary | ICD-10-CM | POA: Diagnosis not present

## 2023-04-14 LAB — BASIC METABOLIC PANEL
Anion gap: 8 (ref 5–15)
BUN: 17 mg/dL (ref 8–23)
CO2: 22 mmol/L (ref 22–32)
Calcium: 8 mg/dL — ABNORMAL LOW (ref 8.9–10.3)
Chloride: 106 mmol/L (ref 98–111)
Creatinine, Ser: 0.72 mg/dL (ref 0.44–1.00)
GFR, Estimated: >60 mL/min (ref >60)
Glucose, Bld: 124 mg/dL — ABNORMAL HIGH (ref 70–99)
Potassium: 3.8 mmol/L (ref 3.5–5.1)
Sodium: 136 mmol/L (ref 135–145)

## 2023-04-14 LAB — CBC
HCT: 36.8 % (ref 36.0–46.0)
Hemoglobin: 12.1 g/dL (ref 12.0–15.0)
MCH: 29.8 pg (ref 26.0–34.0)
MCHC: 32.9 g/dL (ref 30.0–36.0)
MCV: 90.6 fL (ref 80.0–100.0)
Platelets: 200 10*3/uL (ref 150–400)
RBC: 4.06 MIL/uL (ref 3.87–5.11)
RDW: 12.7 % (ref 11.5–15.5)
WBC: 7.4 10*3/uL (ref 4.0–10.5)
nRBC: 0 % (ref 0.0–0.2)

## 2023-04-14 MED ORDER — ASPIRIN 81 MG PO CHEW: 81.0000 mg | CHEWABLE_TABLET | Freq: Two times a day (BID) | ORAL | 0 refills | Status: AC

## 2023-04-14 MED ORDER — HYDROCODONE-ACETAMINOPHEN 5-325 MG PO TABS: 1.0000 | ORAL_TABLET | Freq: Four times a day (QID) | ORAL | 0 refills | Status: AC | PRN

## 2023-04-14 MED ORDER — METHOCARBAMOL 500 MG PO TABS: 500.0000 mg | ORAL_TABLET | Freq: Four times a day (QID) | ORAL | 1 refills | Status: AC | PRN

## 2023-04-14 NOTE — Progress Notes (Signed)
Physical Therapy Treatment Patient Details Name: Lindsey Young MRN: [id number] DOB: [birth year] Today's Date: 04/14/2023   History of Present Illness 72 yo female presents to therapy s/p R THA, anterior approach on 04/13/2023 due to failure of conservative measures. Pt PMH includes but is not limited to: L THA, thrombocytopenia, OA, R ankle fx s/p ORIF and falls.    PT Comments    Pt continues very motivated and progressing well with mobility.  Pt up to ambulate in hall, negotiated stairs and reviewed car transfers.  Pt eager for dc home this date.   Recommendations for follow up therapy are one component of a multi-disciplinary discharge planning process, led by the attending physician.  Recommendations may be updated based on patient status, additional functional criteria and insurance authorization.  Follow Up Recommendations       Assistance Recommended at Discharge Intermittent Supervision/Assistance  Patient can return home with the following A little help with walking and/or transfers;A little help with bathing/dressing/bathroom;Assistance with cooking/housework;Assist for transportation;Help with stairs or ramp for entrance   Equipment Recommendations  None recommended by PT    Recommendations for Other Services       Precautions / Restrictions Precautions Precautions: Fall Restrictions Weight Bearing Restrictions: No RLE Weight Bearing: Weight bearing as tolerated     Mobility  Bed Mobility Overal bed mobility: Needs Assistance Bed Mobility: Supine to Sit     Supine to sit: Supervision     General bed mobility comments: Pt up in chair and requests back to same    Transfers Overall transfer level: Needs assistance Equipment used: Rolling walker (2 wheels) Transfers: Sit to/from Stand Sit to Stand: Min guard, Supervision           General transfer comment: min cues for proper UE and AD placement    Ambulation/Gait Ambulation/Gait assistance: Min  guard, Supervision Gait Distance (Feet): 100 Feet Assistive device: Rolling walker (2 wheels) Gait Pattern/deviations: Step-to pattern, Step-through pattern, Decreased step length - right, Decreased step length - left, Shuffle, Antalgic Gait velocity: decreased     General Gait Details: min cues for posture and position from RW   Stairs Stairs: Yes Stairs assistance: Min assist Stair Management: No rails, Step to pattern, Forwards, With walker Number of Stairs: 2 General stair comments: Single step x 2 with cues for sequence   Wheelchair Mobility    Modified Rankin (Stroke Patients Only)       Balance Overall balance assessment: History of Falls, Needs assistance Sitting-balance support: Feet supported Sitting balance-Leahy Scale: Good     Standing balance support: No upper extremity supported Standing balance-Leahy Scale: Fair                              Cognition Arousal/Alertness: Awake/alert Behavior During Therapy: WFL for tasks assessed/performed Overall Cognitive Status: Within Functional Limits for tasks assessed                                          Exercises Total Joint Exercises Ankle Circles/Pumps: AROM, Both, 20 reps Quad Sets: AROM, Both, 10 reps, Supine Heel Slides: AAROM, Right, 20 reps, Supine Hip ABduction/ADduction: AAROM, Right, 15 reps, Supine    General Comments        Pertinent Vitals/Pain Pain Assessment Pain Assessment: 0-10 Pain Score: 3  Pain Location: R hip Pain Descriptors / Indicators:  Aching, Sore Pain Intervention(s): Limited activity within patient's tolerance, Monitored during session, Premedicated before session, Ice applied    Home Living                          Prior Function            PT Goals (current goals can now be found in the care plan section) Acute Rehab PT Goals Patient Stated Goal: to get back to excercise classes, walking 3 miles and be able to sit  without pain for longer durations PT Goal Formulation: With patient Time For Goal Achievement: 04/27/23 Potential to Achieve Goals: Good Progress towards PT goals: Progressing toward goals    Frequency    7X/week      PT Plan Current plan remains appropriate    Co-evaluation              AM-PAC PT "6 Clicks" Mobility   Outcome Measure  Help needed turning from your back to your side while in a flat bed without using bedrails?: None Help needed moving from lying on your back to sitting on the side of a flat bed without using bedrails?: None Help needed moving to and from a bed to a chair (including a wheelchair)?: A Little Help needed standing up from a chair using your arms (e.g., wheelchair or bedside chair)?: A Little Help needed to walk in hospital room?: A Little Help needed climbing 3-5 steps with a railing? : A Little 6 Click Score: 20    End of Session Equipment Utilized During Treatment: Gait belt Activity Tolerance: Patient tolerated treatment well Patient left: in chair;with call bell/phone within reach Nurse Communication: Mobility status PT Visit Diagnosis: Unsteadiness on feet (R26.81);Other abnormalities of gait and mobility (R26.89);Muscle weakness (generalized) (M62.81);History of falling (Z91.81);Pain Pain - Right/Left: Right Pain - part of body: Hip     Time: 1610-9604 PT Time Calculation (min) (ACUTE ONLY): 15 min  Charges:  $Therapeutic Activity: 8-22 mins                     Mauro Kaufmann PT Acute Rehabilitation Services Pager [phone] Office [phone]    Keiran Sias 04/14/2023, 1:34 PM

## 2023-04-14 NOTE — Progress Notes (Signed)
Subjective: 1 Day Post-Op Procedure(s) (LRB): RIGHT TOTAL HIP ARTHROPLASTY ANTERIOR APPROACH (Right) Patient reports pain as mild.  She is currently working with therapy.  Durene Cal does feel that it is appropriate for her to be able to go home this afternoon because she is mobilizing well.  Objective: Vital signs in last 24 hours: Temp:  [97.8 F (36.6 C)-98.8 F (37.1 C)] 98.3 F (36.8 C) (05/25 0945) Pulse Rate:  [54-63] 59 (05/25 0945) Resp:  [16-18] 17 (05/25 0945) BP: (97-120)/(48-66) 97/48 (05/25 0945) SpO2:  [96 %-99 %] 96 % (05/25 0945)  Intake/Output from previous day: 05/24 0701 - 05/25 0700 In: 3929 [P.O.:1200; I.V.:2429; IV Piggyback:300] Out: 2300 [Urine:2200; Blood:100] Intake/Output this shift: Total I/O In: 120 [P.O.:120] Out: 200 [Urine:200]  Recent Labs    04/14/23 0323  HGB 12.1   Recent Labs    04/14/23 0323  WBC 7.4  RBC 4.06  HCT 36.8  PLT 200   Recent Labs    04/14/23 0323  NA 136  K 3.8  CL 106  CO2 22  BUN 17  CREATININE 0.72  GLUCOSE 124*  CALCIUM 8.0*   No results for input(s): "LABPT", "INR" in the last 72 hours.  Sensation intact distally Intact pulses distally Dorsiflexion/Plantar flexion intact Incision: dressing C/D/I   Assessment/Plan: 1 Day Post-Op Procedure(s) (LRB): RIGHT TOTAL HIP ARTHROPLASTY ANTERIOR APPROACH (Right) Up with therapy Discharge home with home health      Kathryne Hitch 04/14/2023, 11:11 AM

## 2023-04-14 NOTE — Discharge Instructions (Signed)

## 2023-04-14 NOTE — Progress Notes (Signed)
Physical Therapy Treatment Patient Details Name: Lindsey Young MRN: [id number] DOB: [birth year] Today's Date: 04/14/2023   History of Present Illness 72 yo female presents to therapy s/p R THA, anterior approach on 04/13/2023 due to failure of conservative measures. Pt PMH includes but is not limited to: L THA, thrombocytopenia, OA, R ankle fx s/p ORIF and falls.    PT Comments    Pt motivated and progressing well with mobility.  Pt up to EOB sitting unassisted and to hall to ambulate increased distance progressing to reciprocal gait.  HEP initiated.  Pt eager for dc home this date.   Recommendations for follow up therapy are one component of a multi-disciplinary discharge planning process, led by the attending physician.  Recommendations may be updated based on patient status, additional functional criteria and insurance authorization.  Follow Up Recommendations       Assistance Recommended at Discharge Intermittent Supervision/Assistance  Patient can return home with the following A little help with walking and/or transfers;A little help with bathing/dressing/bathroom;Assistance with cooking/housework;Assist for transportation;Help with stairs or ramp for entrance   Equipment Recommendations  None recommended by PT    Recommendations for Other Services       Precautions / Restrictions Precautions Precautions: Fall Restrictions Weight Bearing Restrictions: No RLE Weight Bearing: Weight bearing as tolerated     Mobility  Bed Mobility Overal bed mobility: Needs Assistance Bed Mobility: Supine to Sit     Supine to sit: Supervision     General bed mobility comments: pt self assisting R LE with L LE    Transfers Overall transfer level: Needs assistance Equipment used: Rolling walker (2 wheels) Transfers: Sit to/from Stand Sit to Stand: Min guard, Supervision           General transfer comment: min cues for proper UE and AD placement     Ambulation/Gait Ambulation/Gait assistance: Min guard, Supervision Gait Distance (Feet): 145 Feet Assistive device: Rolling walker (2 wheels) Gait Pattern/deviations: Step-to pattern, Step-through pattern, Decreased step length - right, Decreased step length - left, Shuffle, Antalgic Gait velocity: decreased     General Gait Details: pt reported feeling like her R LE was longer than the L and slight lateral sway, min cues for posture and position from [employer]             Wheelchair Mobility    Modified Rankin (Stroke Patients Only)       Balance Overall balance assessment: History of Falls, Needs assistance Sitting-balance support: Feet supported Sitting balance-Leahy Scale: Good     Standing balance support: No upper extremity supported Standing balance-Leahy Scale: Fair                              Cognition Arousal/Alertness: Awake/alert Behavior During Therapy: WFL for tasks assessed/performed Overall Cognitive Status: Within Functional Limits for tasks assessed                                          Exercises Total Joint Exercises Ankle Circles/Pumps: AROM, Both, 20 reps Quad Sets: AROM, Both, 10 reps, Supine Heel Slides: AAROM, Right, 20 reps, Supine Hip ABduction/ADduction: AAROM, Right, 15 reps, Supine    General Comments        Pertinent Vitals/Pain Pain Assessment Pain Assessment: 0-10 Pain Score: 4  Pain Location: R hip Pain Descriptors / Indicators:  Aching, Sore Pain Intervention(s): Limited activity within patient's tolerance, Monitored during session, Premedicated before session, Ice applied    Home Living                          Prior Function            PT Goals (current goals can now be found in the care plan section) Acute Rehab PT Goals Patient Stated Goal: to get back to excercise classes, walking 3 miles and be able to sit without pain for longer durations PT Goal  Formulation: With patient Time For Goal Achievement: 04/27/23 Potential to Achieve Goals: Good Progress towards PT goals: Progressing toward goals    Frequency    7X/week      PT Plan Current plan remains appropriate    Co-evaluation              AM-PAC PT "6 Clicks" Mobility   Outcome Measure  Help needed turning from your back to your side while in a flat bed without using bedrails?: None Help needed moving from lying on your back to sitting on the side of a flat bed without using bedrails?: None Help needed moving to and from a bed to a chair (including a wheelchair)?: A Little Help needed standing up from a chair using your arms (e.g., wheelchair or bedside chair)?: A Little Help needed to walk in hospital room?: A Little Help needed climbing 3-5 steps with a railing? : A Lot 6 Click Score: 19    End of Session Equipment Utilized During Treatment: Gait belt Activity Tolerance: Patient tolerated treatment well Patient left: in chair;with call bell/phone within reach Nurse Communication: Mobility status PT Visit Diagnosis: Unsteadiness on feet (R26.81);Other abnormalities of gait and mobility (R26.89);Muscle weakness (generalized) (M62.81);History of falling (Z91.81);Pain Pain - Right/Left: Right Pain - part of body: Hip     Time: 1610-9604 PT Time Calculation (min) (ACUTE ONLY): 29 min  Charges:  $Gait Training: 8-22 mins $Therapeutic Exercise: 8-22 mins                     Mauro Kaufmann PT Acute Rehabilitation Services Pager [phone] Office [phone]    [facility] 04/14/2023, 9:28 AM

## 2023-04-14 NOTE — Discharge Summary (Signed)
Patient ID: Lindsey Young MRN: [id number] DOB/AGE: [birth year] 72 y.o.  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 Mendel Ryder for SVT- (2007 approx.), then referred to Dr. Graciela Husbands, , told not a candidate for ablation.    H/O thyroid cyst    Osteopenia    PONV (postoperative nausea and vomiting)    Thrombocytopenia (HCC) 10/12/2014    Surgeries: Procedure(s): RIGHT TOTAL HIP ARTHROPLASTY ANTERIOR APPROACH on 04/13/2023   Consultants:   Discharged Condition: Improved  Hospital Course: CORTNY SWANIGAN is an 72 y.o. female who was admitted 04/13/2023 for operative treatment ofUnilateral primary osteoarthritis, right hip. Patient has severe unremitting pain that affects sleep, daily activities, and work/hobbies. After pre-op clearance the patient was taken to the operating room on 04/13/2023 and underwent  Procedure(s): RIGHT TOTAL HIP ARTHROPLASTY ANTERIOR APPROACH.    Patient was given perioperative antibiotics:  Anti-infectives (From admission, onward)    Start     Dose/Rate Route Frequency Ordered Stop   04/13/23 1300  ceFAZolin (ANCEF) IVPB 1 g/50 mL premix        1 g 100 mL/hr over 30 Minutes Intravenous Every 6 hours 04/13/23 1017 04/13/23 1927   04/13/23 0600  ceFAZolin (ANCEF) IVPB 2g/100 mL premix        2 g 200 mL/hr over 30 Minutes Intravenous On call to O.R. 04/13/23 0542 04/13/23 0717        Patient was given sequential compression devices, early ambulation, and chemoprophylaxis to prevent DVT.  Patient benefited maximally from  hospital stay and there were no complications.    Recent vital signs: Patient Vitals for the past 24 hrs:  BP Temp Temp src Pulse Resp SpO2  04/14/23 0945 (!) 97/48 98.3 F (36.8 C) -- (!) 59 17 96 %  04/14/23 0541 (!) 117/52 98.1 F (36.7 C) Oral 63 18 99 %  04/14/23 0205 (!) 117/54 98.2 F (36.8 C) Oral 60 16 99 %  04/13/23 2153 (!) 120/57 98.8 F (37.1 C) Oral 60 16 98 %  04/13/23 1836 117/63 98.1 F (36.7 C) Oral 60 16 99 %  04/13/23 1508 114/66 97.8 F (36.6 C) Oral (!) 54 18 98 %     Recent laboratory studies:  Recent Labs    04/14/23 0323  WBC 7.4  HGB 12.1  HCT 36.8  PLT 200  NA 136  K 3.8  CL 106  CO2 22  BUN 17  CREATININE 0.72  GLUCOSE 124*  CALCIUM 8.0*     Discharge Medications:   Allergies as of 04/14/2023       Reactions   Codeine Nausea And Vomiting   Make pain WORSE        Medication List     TAKE these medications    acetaminophen 500 MG tablet Commonly known as: TYLENOL Take 1,000 mg by mouth every 6 (six) hours as needed for moderate pain.   aspirin 81 MG chewable tablet Chew 1 tablet (81 mg total) by mouth 2 (two) times daily.  chlorpheniramine 4 MG tablet Commonly known as: CHLOR-TRIMETON Take 4 mg by mouth at bedtime as needed for allergies.   HYDROcodone-acetaminophen 5-325 MG tablet Commonly known as: Norco Take 1-2 tablets by mouth every 6 (six) hours as needed for moderate pain.   methocarbamol 500 MG tablet Commonly known as: ROBAXIN Take 1 tablet (500 mg total) by mouth every 6 (six) hours as needed for muscle spasms.   multivitamin with minerals Tabs tablet Take 1 tablet by mouth daily.   Vitamin D3 50 MCG (2000 UT) capsule Take 2,000 Units by mouth daily.               Durable Medical Equipment  (From admission, onward)           Start     Ordered   04/13/23 1018  DME 3 n 1  Once        04/13/23 1017   04/13/23 1018  DME Walker rolling  Once       Question Answer Comment  Walker: With 5  Inch Wheels   Patient needs a walker to treat with the following condition Status post total replacement of right hip      04/13/23 1017            Diagnostic Studies: DG HIP UNILAT WITH PELVIS 1V RIGHT  Result Date: 04/13/2023 CLINICAL DATA:  Elective surgery. EXAM: DG HIP (WITH OR WITHOUT PELVIS) 1V RIGHT COMPARISON:  None Available. FINDINGS: Three fluoroscopic spot views of the pelvis and right hip obtained in the operating room. Images obtained during hip arthroplasty. Fluoroscopy time 17 seconds. Dose 1.6324 mGy. IMPRESSION: Intraoperative fluoroscopy during right hip arthroplasty. Electronically Signed   By: Narda Rutherford M.D.   On: 04/13/2023 09:07   DG C-Arm 1-60 Min-No Report  Result Date: 04/13/2023 Fluoroscopy was utilized by the requesting physician.  No radiographic interpretation.   CT CARDIAC SCORING (SELF PAY ONLY)  Addendum Date: 04/10/2023   ADDENDUM REPORT: 04/10/2023 21:36 EXAM: OVER-READ INTERPRETATION  CT CHEST The following report is an over-read performed by radiologist Dr. Arnoldo Morale [facility] on 04/10/2023. This over-read does not include interpretation of cardiac or coronary anatomy or pathology. The coronary calcium score interpretation by the cardiologist is attached. COMPARISON:  None. FINDINGS: Left lung base linear scarring. A 2.2 cm nodular density in the right middle lobe, likely scarring. Direct comparison with prior chest CTs, if available, recommended. If no prior studies are available, follow-up with CT in 3-6 months recommended. IMPRESSION: Probable 2.2 cm nodular scarring in the right middle lobe. Follow-up recommended. Electronically Signed   By: Elgie Collard M.D.   On: 04/10/2023 21:36   Result Date: 04/10/2023 : Cardiovascular Disease Risk stratification EXAM: Coronary Calcium Score TECHNIQUE: A gated, non-contrast computed tomography scan of the heart was performed using 3mm slice thickness. Axial images were analyzed on  a dedicated workstation. Calcium scoring of the coronary arteries was performed using the Agatston method. FINDINGS: Coronary arteries: Normal origins. Coronary Calcium Score: Left main: 0 Left anterior descending artery: 0 Left circumflex artery: 0 Right coronary artery: 0 Total: 0 Percentile: 0 Pericardium: Normal. Ascending Aorta: Normal caliber. Non-cardiac: See separate report from [facility]. IMPRESSION: Coronary calcium score of 0. This was 0 percentile for age-, race-, and sex-matched controls. RECOMMENDATIONS: Coronary artery calcium (CAC) score is a strong predictor of incident coronary heart disease (CHD) and provides predictive information beyond traditional risk factors. CAC scoring is reasonable to use in the decision to withhold, postpone, or initiate  statin therapy in intermediate-risk or selected borderline-risk asymptomatic adults (age 23-75 years and LDL-C >=70 to <190 mg/dL) who do not have diabetes or established atherosclerotic cardiovascular disease (ASCVD).* In intermediate-risk (10-year ASCVD risk >=7.5% to <20%) adults or selected borderline-risk (10-year ASCVD risk >=5% to <7.5%) adults in whom a CAC score is measured for the purpose of making a treatment decision the following recommendations have been made: If CAC=0, it is reasonable to withhold statin therapy and reassess in 5 to 10 years, as long as higher risk conditions are absent (diabetes mellitus, family history of premature CHD in first degree relatives (males <55 years; females <65 years), cigarette smoking, or LDL >=190 mg/dL). If CAC is 1 to 99, it is reasonable to initiate statin therapy for patients >=51 years of age. If CAC is >=100 or >=75th percentile, it is reasonable to initiate statin therapy at any age. Cardiology referral should be considered for patients with CAC scores >=400 or >=75th percentile. *2018 AHA/ACC/AACVPR/AAPA/ABC/ACPM/ADA/AGS/APhA/ASPC/NLA/PCNA Guideline on the Management of Blood  Cholesterol: A Report of the American College of Cardiology/American Heart Association Task Force on Clinical Practice Guidelines. J Am Coll Cardiol. 2019;73(24):3168-3209. Thomasene Ripple, DO The noncardiac portion of this study will be interpreted in separate report by the radiologist. Electronically Signed: By: Thomasene Ripple D.O. On: 04/05/2023 18:17    Disposition: Discharge disposition: 01-Home or Self Care          Follow-up Information     [facility] Follow up.   Specialty: Home Health Services Why: to provide home physical therapy visits Contact information: [address] [place] [address] [phone]         Kathryne Hitch, MD Follow up in 2 week(s).   Specialty: Orthopedic Surgery Contact information: [address] [place] [address] [phone]                  Signed: Kathryne Hitch 04/14/2023, 12:13 PM

## 2023-04-15 DIAGNOSIS — Z7982 Long term (current) use of aspirin: Secondary | ICD-10-CM | POA: Diagnosis not present

## 2023-04-15 DIAGNOSIS — Z471 Aftercare following joint replacement surgery: Secondary | ICD-10-CM | POA: Diagnosis not present

## 2023-04-15 DIAGNOSIS — Z96641 Presence of right artificial hip joint: Secondary | ICD-10-CM | POA: Diagnosis not present

## 2023-04-17 ENCOUNTER — Encounter (HOSPITAL_COMMUNITY): Payer: Self-pay | Admitting: Orthopaedic Surgery

## 2023-04-24 ENCOUNTER — Telehealth: Payer: Self-pay | Admitting: *Deleted

## 2023-04-24 NOTE — Telephone Encounter (Signed)
Ortho bundle call completed. 

## 2023-04-26 ENCOUNTER — Encounter: Payer: Self-pay | Admitting: Orthopaedic Surgery

## 2023-04-26 ENCOUNTER — Telehealth: Payer: Self-pay | Admitting: *Deleted

## 2023-04-26 ENCOUNTER — Ambulatory Visit (INDEPENDENT_AMBULATORY_CARE_PROVIDER_SITE_OTHER): Payer: PPO | Admitting: Orthopaedic Surgery

## 2023-04-26 DIAGNOSIS — Z96641 Presence of right artificial hip joint: Secondary | ICD-10-CM

## 2023-04-26 NOTE — Telephone Encounter (Signed)
Ortho bundle 14 day in office appointment completed. 

## 2023-04-26 NOTE — Progress Notes (Signed)
The patient is 2 weeks status post a right total hip arthroplasty.  She is doing well and reports good range of motion and strength.  She has been compliant with a baby aspirin twice a day and not having to walk with assistive ice.  She is not taking any pain medicine other than Tylenol.  On exam her right hip incision looks good.  The staples are removed and Steri-Strips applied.  Her leg lengths are equal.  Her calf is soft but there is foot and ankle swelling.  She has had previous ankle surgery.  She can stop her baby aspirin twice a day and she can wear compression garments for swelling if needed.  She knows to wait to swim until the July 4 holiday.  Will see her back in 3 months to see how she is doing overall and will have a standing AP pelvis at that visit.  We are operating her husband in 2 weeks that she would just come with his visits and follow-up and let us know how she is doing.

## 2023-05-30 ENCOUNTER — Ambulatory Visit
Admission: RE | Admit: 2023-05-30 | Discharge: 2023-05-30 | Disposition: A | Discharge status: Home / Self Care | Payer: PPO | Source: Ambulatory Visit | Attending: Pain Medicine | Admitting: Pain Medicine

## 2023-05-30 DIAGNOSIS — Z1239 Encounter for other screening for malignant neoplasm of breast: Secondary | ICD-10-CM

## 2023-05-30 DIAGNOSIS — Z1231 Encounter for screening mammogram for malignant neoplasm of breast: Secondary | ICD-10-CM | POA: Diagnosis not present

## 2023-06-05 DIAGNOSIS — D123 Benign neoplasm of transverse colon: Secondary | ICD-10-CM | POA: Diagnosis not present

## 2023-06-05 DIAGNOSIS — K6289 Other specified diseases of anus and rectum: Secondary | ICD-10-CM | POA: Diagnosis not present

## 2023-06-05 DIAGNOSIS — Z09 Encounter for follow-up examination after completed treatment for conditions other than malignant neoplasm: Secondary | ICD-10-CM | POA: Diagnosis not present

## 2023-06-05 DIAGNOSIS — K5730 Diverticulosis of large intestine without perforation or abscess without bleeding: Secondary | ICD-10-CM | POA: Diagnosis not present

## 2023-06-05 DIAGNOSIS — K648 Other hemorrhoids: Secondary | ICD-10-CM | POA: Diagnosis not present

## 2023-06-05 DIAGNOSIS — Z86010 Personal history of colonic polyps: Secondary | ICD-10-CM | POA: Diagnosis not present

## 2023-06-07 DIAGNOSIS — D123 Benign neoplasm of transverse colon: Secondary | ICD-10-CM | POA: Diagnosis not present

## 2023-07-03 DIAGNOSIS — D225 Melanocytic nevi of trunk: Secondary | ICD-10-CM | POA: Diagnosis not present

## 2023-07-03 DIAGNOSIS — L821 Other seborrheic keratosis: Secondary | ICD-10-CM | POA: Diagnosis not present

## 2023-07-03 DIAGNOSIS — I788 Other diseases of capillaries: Secondary | ICD-10-CM | POA: Diagnosis not present

## 2023-07-16 ENCOUNTER — Other Ambulatory Visit: Payer: Self-pay | Admitting: Pain Medicine

## 2023-07-16 ENCOUNTER — Encounter: Payer: Self-pay | Admitting: Pain Medicine

## 2023-07-16 DIAGNOSIS — R911 Solitary pulmonary nodule: Secondary | ICD-10-CM

## 2023-07-30 ENCOUNTER — Encounter: Payer: Self-pay | Admitting: Orthopaedic Surgery

## 2023-07-30 ENCOUNTER — Other Ambulatory Visit (INDEPENDENT_AMBULATORY_CARE_PROVIDER_SITE_OTHER): Payer: PPO

## 2023-07-30 ENCOUNTER — Ambulatory Visit: Payer: PPO | Admitting: Orthopaedic Surgery

## 2023-07-30 DIAGNOSIS — Z96641 Presence of right artificial hip joint: Secondary | ICD-10-CM

## 2023-07-30 NOTE — Progress Notes (Signed)
The patient is a 72 year old female well-known to [place].  We replaced her left hip remotely and her right hip just 4 months ago.  She says she is doing great.  She does have occasional knee issues with the left knee.  We did remove hardware from the left patella back 6 years ago.  She says is not bothering her enough to do anything as of yet.  She is walking without assistive device.  Both hips move smoothly and fluidly.  The right hip is just slightly stiff.  Her left knee shows some patellofemoral crepitation but no effusion with good range of motion.  An AP pelvis today shows bilateral total hip arthroplasties with no complicating features.  At this point follow-up for hips can be as needed.  If her knee starts to become an issue for her she knows to come in to see [place] because we would obtain new x-rays and then go from there.  All question concerns were answered and addressed.

## 2023-08-07 ENCOUNTER — Ambulatory Visit
Admission: RE | Admit: 2023-08-07 | Discharge: 2023-08-07 | Disposition: A | Discharge status: Home / Self Care | Payer: PPO | Source: Ambulatory Visit | Attending: Pain Medicine | Admitting: Pain Medicine

## 2023-08-07 DIAGNOSIS — Z23 Encounter for immunization: Secondary | ICD-10-CM | POA: Diagnosis not present

## 2023-08-07 DIAGNOSIS — R918 Other nonspecific abnormal finding of lung field: Secondary | ICD-10-CM | POA: Diagnosis not present

## 2023-08-07 DIAGNOSIS — R911 Solitary pulmonary nodule: Secondary | ICD-10-CM

## 2023-08-22 DIAGNOSIS — H43393 Other vitreous opacities, bilateral: Secondary | ICD-10-CM | POA: Diagnosis not present

## 2023-11-08 DIAGNOSIS — F3341 Major depressive disorder, recurrent, in partial remission: Secondary | ICD-10-CM | POA: Diagnosis not present

## 2023-11-08 DIAGNOSIS — F411 Generalized anxiety disorder: Secondary | ICD-10-CM | POA: Diagnosis not present

## 2024-03-06 DIAGNOSIS — M8580 Other specified disorders of bone density and structure, unspecified site: Secondary | ICD-10-CM | POA: Diagnosis not present

## 2024-03-06 DIAGNOSIS — E7800 Pure hypercholesterolemia, unspecified: Secondary | ICD-10-CM | POA: Diagnosis not present

## 2024-03-10 DIAGNOSIS — M8580 Other specified disorders of bone density and structure, unspecified site: Secondary | ICD-10-CM | POA: Diagnosis not present

## 2024-03-10 DIAGNOSIS — Z6822 Body mass index (BMI) 22.0-22.9, adult: Secondary | ICD-10-CM | POA: Diagnosis not present

## 2024-03-10 DIAGNOSIS — E7800 Pure hypercholesterolemia, unspecified: Secondary | ICD-10-CM | POA: Diagnosis not present

## 2024-03-10 DIAGNOSIS — F411 Generalized anxiety disorder: Secondary | ICD-10-CM | POA: Diagnosis not present

## 2024-03-10 DIAGNOSIS — R911 Solitary pulmonary nodule: Secondary | ICD-10-CM | POA: Diagnosis not present

## 2024-03-10 DIAGNOSIS — Z0000 Encounter for general adult medical examination without abnormal findings: Secondary | ICD-10-CM | POA: Diagnosis not present

## 2024-03-10 DIAGNOSIS — F321 Major depressive disorder, single episode, moderate: Secondary | ICD-10-CM | POA: Diagnosis not present

## 2024-03-11 ENCOUNTER — Other Ambulatory Visit: Payer: Self-pay | Admitting: Family Medicine

## 2024-03-11 DIAGNOSIS — R911 Solitary pulmonary nodule: Secondary | ICD-10-CM

## 2024-03-14 ENCOUNTER — Ambulatory Visit
Admission: RE | Admit: 2024-03-14 | Discharge: 2024-03-14 | Disposition: A | Discharge status: Home / Self Care | Source: Ambulatory Visit | Attending: Family Medicine | Admitting: Family Medicine

## 2024-03-14 DIAGNOSIS — R911 Solitary pulmonary nodule: Secondary | ICD-10-CM

## 2024-03-14 DIAGNOSIS — J984 Other disorders of lung: Secondary | ICD-10-CM | POA: Diagnosis not present

## 2024-05-01 DIAGNOSIS — R6881 Early satiety: Secondary | ICD-10-CM | POA: Diagnosis not present

## 2024-05-01 DIAGNOSIS — R5383 Other fatigue: Secondary | ICD-10-CM | POA: Diagnosis not present

## 2024-05-01 DIAGNOSIS — Z6822 Body mass index (BMI) 22.0-22.9, adult: Secondary | ICD-10-CM | POA: Diagnosis not present

## 2024-08-19 DIAGNOSIS — S61313A Laceration without foreign body of left middle finger with damage to nail, initial encounter: Secondary | ICD-10-CM | POA: Diagnosis not present

## 2024-08-19 DIAGNOSIS — S62633B Displaced fracture of distal phalanx of left middle finger, initial encounter for open fracture: Secondary | ICD-10-CM | POA: Diagnosis not present

## 2024-08-20 DIAGNOSIS — S62633B Displaced fracture of distal phalanx of left middle finger, initial encounter for open fracture: Secondary | ICD-10-CM | POA: Diagnosis not present

## 2024-08-21 DIAGNOSIS — H35372 Puckering of macula, left eye: Secondary | ICD-10-CM | POA: Diagnosis not present

## 2024-08-26 DIAGNOSIS — M79642 Pain in left hand: Secondary | ICD-10-CM | POA: Diagnosis not present

## 2024-12-01 ENCOUNTER — Encounter: Admitting: Physician Assistant
